# Patient Record
Sex: Male | Born: 1955 | Race: Black or African American | Hispanic: No | Marital: Married | Smoking: Current every day smoker
Health system: Southern US, Community
[De-identification: ages and names within clinical notes are randomized; demographics above are authoritative.]

## PROBLEM LIST (undated history)

## (undated) DIAGNOSIS — J449 Chronic obstructive pulmonary disease, unspecified: Secondary | ICD-10-CM

## (undated) DIAGNOSIS — J189 Pneumonia, unspecified organism: Secondary | ICD-10-CM

---

## 2017-10-30 ENCOUNTER — Inpatient Hospital Stay
Admission: EM | Admit: 2017-10-30 | Discharge: 2017-11-02 | DRG: 194 | Disposition: A | Discharge status: Home / Self Care | Payer: Self-pay | Attending: Internal Medicine | Admitting: Internal Medicine

## 2017-10-30 ENCOUNTER — Emergency Department: Payer: MEDICAID

## 2017-10-30 DIAGNOSIS — J101 Influenza due to other identified influenza virus with other respiratory manifestations: Secondary | ICD-10-CM

## 2017-10-30 DIAGNOSIS — J1000 Influenza due to other identified influenza virus with unspecified type of pneumonia: Principal | ICD-10-CM | POA: Diagnosis present

## 2017-10-30 DIAGNOSIS — F17210 Nicotine dependence, cigarettes, uncomplicated: Secondary | ICD-10-CM | POA: Diagnosis present

## 2017-10-30 DIAGNOSIS — J189 Pneumonia, unspecified organism: Secondary | ICD-10-CM

## 2017-10-30 DIAGNOSIS — J449 Chronic obstructive pulmonary disease, unspecified: Secondary | ICD-10-CM

## 2017-10-30 DIAGNOSIS — J440 Chronic obstructive pulmonary disease with acute lower respiratory infection: Secondary | ICD-10-CM | POA: Diagnosis present

## 2017-10-30 DIAGNOSIS — J441 Chronic obstructive pulmonary disease with (acute) exacerbation: Secondary | ICD-10-CM | POA: Diagnosis present

## 2017-10-30 HISTORY — DX: Chronic obstructive pulmonary disease, unspecified: J44.9

## 2017-10-30 HISTORY — DX: Pneumonia, unspecified organism: J18.9

## 2017-10-30 LAB — COMPREHENSIVE METABOLIC PANEL
ALT: 28 U/L (ref 0–55)
AST (SGOT): 77 U/L — ABNORMAL HIGH (ref 5–34)
Albumin/Globulin Ratio: 1 (ref 0.9–2.2)
Albumin: 3.9 g/dL (ref 3.5–5.0)
Alkaline Phosphatase: 142 U/L — ABNORMAL HIGH (ref 38–106)
BUN: 12 mg/dL (ref 9.0–28.0)
Bilirubin, Total: 0.7 mg/dL (ref 0.2–1.2)
CO2: 22 mEq/L (ref 22–29)
Calcium: 9.2 mg/dL (ref 8.5–10.5)
Chloride: 98 mEq/L — ABNORMAL LOW (ref 100–111)
Creatinine: 1.2 mg/dL (ref 0.7–1.3)
Globulin: 3.8 g/dL — ABNORMAL HIGH (ref 2.0–3.6)
Glucose: 99 mg/dL (ref 70–100)
Potassium: 3.3 mEq/L — ABNORMAL LOW (ref 3.5–5.1)
Protein, Total: 7.7 g/dL (ref 6.0–8.3)
Sodium: 133 mEq/L — ABNORMAL LOW (ref 136–145)

## 2017-10-30 LAB — B-TYPE NATRIURETIC PEPTIDE: B-Natriuretic Peptide: 238 pg/mL — ABNORMAL HIGH (ref 0–100)

## 2017-10-30 LAB — CBC AND DIFFERENTIAL
Absolute NRBC: 0 10*3/uL (ref 0.00–0.00)
Basophils Absolute Automated: 0.05 10*3/uL (ref 0.00–0.08)
Basophils Automated: 0.3 %
Eosinophils Absolute Automated: 0 10*3/uL (ref 0.00–0.44)
Eosinophils Automated: 0 %
Hematocrit: 47.8 % (ref 37.6–49.6)
Hgb: 16.3 g/dL (ref 12.5–17.1)
Immature Granulocytes Absolute: 0.18 10*3/uL — ABNORMAL HIGH (ref 0.00–0.07)
Immature Granulocytes: 0.9 %
Lymphocytes Absolute Automated: 0.7 10*3/uL (ref 0.42–3.22)
Lymphocytes Automated: 3.6 %
MCH: 34 pg — ABNORMAL HIGH (ref 25.1–33.5)
MCHC: 34.1 g/dL (ref 31.5–35.8)
MCV: 99.8 fL — ABNORMAL HIGH (ref 78.0–96.0)
MPV: 10.1 fL (ref 8.9–12.5)
Monocytes Absolute Automated: 2.56 10*3/uL — ABNORMAL HIGH (ref 0.21–0.85)
Monocytes: 13.1 %
Neutrophils Absolute: 16.02 10*3/uL — ABNORMAL HIGH (ref 1.10–6.33)
Neutrophils: 82.1 %
Nucleated RBC: 0 /100 WBC (ref 0.0–0.0)
Platelets: 261 10*3/uL (ref 142–346)
RBC: 4.79 10*6/uL (ref 4.20–5.90)
RDW: 12 % (ref 11–15)
WBC: 19.51 10*3/uL — ABNORMAL HIGH (ref 3.10–9.50)

## 2017-10-30 LAB — ECG 12-LEAD
Atrial Rate: 131 {beats}/min
Atrial Rate: 131 {beats}/min
P Axis: 71 degrees
P Axis: 71 degrees
P-R Interval: 180 ms
P-R Interval: 180 ms
Q-T Interval: 268 ms
Q-T Interval: 268 ms
QRS Duration: 98 ms
QRS Duration: 98 ms
QTC Calculation (Bezet): 395 ms
QTC Calculation (Bezet): 395 ms
R Axis: -78 degrees
R Axis: -78 degrees
T Axis: 93 degrees
T Axis: 93 degrees
Ventricular Rate: 131 {beats}/min
Ventricular Rate: 131 {beats}/min

## 2017-10-30 LAB — URINALYSIS, REFLEX TO MICROSCOPIC EXAM IF INDICATED
Bilirubin, UA: NEGATIVE
Blood, UA: NEGATIVE
Glucose, UA: NEGATIVE
Ketones UA: 20 — AB
Leukocyte Esterase, UA: NEGATIVE
Nitrite, UA: NEGATIVE
Protein, UR: 30 — AB
Specific Gravity UA: 1.015 (ref 1.001–1.035)
Urine pH: 5 (ref 5.0–8.0)
Urobilinogen, UA: NORMAL mg/dL

## 2017-10-30 LAB — I-STAT CG4 VENOUS CARTRIDGE
Lactic Acid I-Stat: 1 mmol/L (ref 0.2–2.0)
i-STAT Base Excess Venous: 0 mEq/L
i-STAT FIO2: 26
i-STAT HCO3 Bicarbonate Venous: 24.5 mEq/L
i-STAT Liters Per Minute: 2
i-STAT O2 Saturation Venous: 35 %
i-STAT Patient Temperature: 99.5
i-STAT Total CO2 Venous: 26 mEq/L
i-STAT pCO2 Venous: 39.5
i-STAT pH Venous: 7.401
i-STAT pO2 Venous: 22

## 2017-10-30 LAB — GFR: EGFR: >60

## 2017-10-30 LAB — TROPONIN I
Troponin I: 0.06 ng/mL (ref 0.00–0.09)
Troponin I: 0.04 ng/mL (ref 0.00–0.09)

## 2017-10-30 LAB — MAGNESIUM: Magnesium: 2 mg/dL (ref 1.6–2.6)

## 2017-10-30 MED ORDER — ALBUTEROL SULFATE (2.5 MG/3ML) 0.083% IN NEBU
5.00 mg | INHALATION_SOLUTION | Freq: Once | RESPIRATORY_TRACT | Status: AC
Start: 2017-10-30 — End: 2017-10-30
  Administered 2017-10-30: 10:00:00 5 mg via RESPIRATORY_TRACT
  Filled 2017-10-30: qty 6

## 2017-10-30 MED ORDER — LEVOFLOXACIN IN D5W 750 MG/150ML IV SOLN
750.00 mg | Freq: Once | INTRAVENOUS | Status: AC
Start: 2017-10-30 — End: 2017-10-30
  Administered 2017-10-30: 10:00:00 750 mg via INTRAVENOUS
  Filled 2017-10-30: qty 150

## 2017-10-30 MED ORDER — SODIUM CHLORIDE 0.9 % IV BOLUS
30.00 mL/kg | Freq: Once | INTRAVENOUS | Status: AC
Start: 2017-10-30 — End: 2017-10-30
  Administered 2017-10-30: 10:00:00 1974 mL via INTRAVENOUS

## 2017-10-30 MED ORDER — HEPARIN SODIUM (PORCINE) 5000 UNIT/ML IJ SOLN
5000.00 [IU] | Freq: Three times a day (TID) | INTRAMUSCULAR | Status: DC
Start: 2017-10-30 — End: 2017-11-02
  Administered 2017-10-30 – 2017-11-02 (×6): 5000 [IU] via SUBCUTANEOUS
  Filled 2017-10-30 (×6): qty 1

## 2017-10-30 MED ORDER — OSELTAMIVIR PHOSPHATE 75 MG PO CAPS
75.00 mg | ORAL_CAPSULE | Freq: Once | ORAL | Status: AC
Start: 2017-10-30 — End: 2017-10-30
  Administered 2017-10-30: 11:00:00 75 mg via ORAL
  Filled 2017-10-30: qty 1

## 2017-10-30 MED ORDER — ACETAMINOPHEN 325 MG PO TABS
650.0000 mg | ORAL_TABLET | ORAL | Status: DC | PRN
Start: 2017-10-30 — End: 2017-11-02

## 2017-10-30 MED ORDER — IBUPROFEN 600 MG PO TABS
600.00 mg | ORAL_TABLET | Freq: Once | ORAL | Status: AC
Start: 2017-10-30 — End: 2017-10-30
  Administered 2017-10-30: 10:00:00 600 mg via ORAL
  Filled 2017-10-30: qty 1

## 2017-10-30 MED ORDER — METHYLPREDNISOLONE SODIUM SUCC 125 MG IJ SOLR
125.00 mg | Freq: Once | INTRAMUSCULAR | Status: AC
Start: 2017-10-30 — End: 2017-10-30
  Administered 2017-10-30: 10:00:00 125 mg via INTRAVENOUS
  Filled 2017-10-30: qty 2

## 2017-10-30 MED ORDER — METHYLPREDNISOLONE SODIUM SUCC 40 MG IJ SOLR
40.00 mg | Freq: Three times a day (TID) | INTRAMUSCULAR | Status: DC
Start: 2017-10-30 — End: 2017-11-01
  Administered 2017-10-30 – 2017-11-01 (×6): 40 mg via INTRAVENOUS
  Filled 2017-10-30 (×6): qty 1

## 2017-10-30 MED ORDER — ALBUTEROL-IPRATROPIUM 2.5-0.5 (3) MG/3ML IN SOLN
3.00 mL | Freq: Four times a day (QID) | RESPIRATORY_TRACT | Status: DC
Start: 2017-10-30 — End: 2017-11-02
  Administered 2017-10-30 – 2017-11-02 (×8): 3 mL via RESPIRATORY_TRACT
  Filled 2017-10-30 (×8): qty 3

## 2017-10-30 MED ORDER — OSELTAMIVIR PHOSPHATE 75 MG PO CAPS
75.00 mg | ORAL_CAPSULE | Freq: Two times a day (BID) | ORAL | Status: DC
Start: 2017-10-30 — End: 2017-11-02
  Administered 2017-10-30 – 2017-11-02 (×6): 75 mg via ORAL
  Filled 2017-10-30 (×8): qty 1

## 2017-10-30 MED ORDER — IPRATROPIUM BROMIDE 0.02 % IN SOLN
0.50 mg | Freq: Once | RESPIRATORY_TRACT | Status: AC
Start: 2017-10-30 — End: 2017-10-30
  Administered 2017-10-30: 10:00:00 0.5 mg via RESPIRATORY_TRACT
  Filled 2017-10-30: qty 2.5

## 2017-10-30 MED ORDER — ONDANSETRON HCL 4 MG/2ML IJ SOLN
4.00 mg | Freq: Three times a day (TID) | INTRAMUSCULAR | Status: DC | PRN
Start: 2017-10-30 — End: 2017-11-02

## 2017-10-30 MED ORDER — ACETAMINOPHEN 500 MG PO TABS
1000.00 mg | ORAL_TABLET | Freq: Once | ORAL | Status: AC
Start: 2017-10-30 — End: 2017-10-30
  Administered 2017-10-30: 11:00:00 1000 mg via ORAL
  Filled 2017-10-30: qty 2

## 2017-10-30 MED ORDER — SENNOSIDES-DOCUSATE SODIUM 8.6-50 MG PO TABS
2.00 | ORAL_TABLET | Freq: Two times a day (BID) | ORAL | Status: DC | PRN
Start: 2017-10-30 — End: 2017-11-02

## 2017-10-30 MED ORDER — LEVOFLOXACIN IN D5W 750 MG/150ML IV SOLN
750.00 mg | INTRAVENOUS | Status: DC
Start: 2017-10-31 — End: 2017-10-31
  Filled 2017-10-30: qty 150

## 2017-10-30 NOTE — ED Notes (Signed)
Bed: S 23  Expected date:   Expected time:   Means of arrival:   Comments:  M 429

## 2017-10-30 NOTE — H&P (Signed)
ADMISSION HISTORY AND PHYSICAL EXAM    Date Time: 10/30/17 4:23 PM  Patient Name: Gary Hunter,Gary Gary JR.  Attending Physician: Gae Bon, MD    Assessment:   Acute influenza   CAP   Copd exacerbation       Plan:   Admit to inpatient     1. Acute influenza   tamiflu   Supportive care     2. Cad - levaquin   Follow cx     3. Copd exacerbation -   Nebs   Steroids     dvt prophylaxis       History of Present Illness:   Gary Hunter. is a 62 y.o. male who presents to the hospital with COPD, recent pneumonia; BIBA who presents with SOB.     Pt with worsening cough for the past few days. He started having SOB this morning and appeared mildly confused by family, which prompted 911 call. EMS noted pt O2 sats 88% on RA, placed on 4L O2 with improvement to 96%. Febrile to 100.8F en route. Pt states he feels his symptoms are similar to previous pneumonia. Took Tylenol this morning (does not remember what time). Denies chest pain.     Per daughter, pt took a 36 hr bus ride 3 days ago from [place] and started coughing after that .   Notes was treated for pneumonia by PCP 1 month ago . Does not recall abx .       Past Medical History:     Past Medical History:   Diagnosis Date   . Chronic obstructive pulmonary disease    . Pneumonia        Past Surgical History:   History reviewed. No pertinent surgical history.    Family History:   History reviewed. No pertinent family history.    Social History:     Social History     Social History   . Marital status: Married     Spouse name: N/A   . Number of children: N/A   . Years of education: N/A     Social History Main Topics   . Smoking status: Current Every Day Smoker     Packs/day: 1.00     Types: Cigarettes   . Smokeless tobacco: Never Used   . Alcohol use Yes      Comment: drinks beer socially   . Drug use: No   . Sexual activity: Not on file     Other Topics Concern   . Not on file     Social History Narrative   . No narrative on file       Allergies:   No Known  Allergies    Medications:     No prescriptions prior to admission.       Review of Systems:   A comprehensive review of systems was: Negative except hpi   History obtained from chart review and the patient  Respiratory ROS: positive for - cough and shortness of breath  Cardiovascular ROS: positive for - chest pain and dyspnea on exertion  Gastrointestinal ROS: no abdominal pain, change in bowel habits, or black or bloody stools  Genito-Urinary ROS: no dysuria, trouble voiding, or hematuria  Musculoskeletal ROS: negative  Neurological ROS: no TIA or stroke symptoms    Physical Exam:     Vitals:    10/30/17 1500   BP: 107/57   Pulse: 88   Resp: (!) 25   Temp:    SpO2: 96%  Intake and Output Summary (Last 24 hours) at Date Time  No intake or output data in the 24 hours ending 10/30/17 1623    General appearance - alert, well appearing, and in no distress and oriented to person, place, and time  Mental status - alert, oriented to person, place, and time  Eyes - pupils equal and reactive, extraocular eye movements intact  Chest - wheezing noted bilateral diminished   Heart - normal rate and regular rhythm  Abdomen - soft, nontender, nondistended, no masses or organomegaly  Neurological - cranial nerves II through XII intact  Musculoskeletal - 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    Labs:     Results     Procedure Component Value Units Date/Time    Blood Culture Aerobic/Anaerobic #1 [[id number]] Collected:  10/30/17 0931    Specimen:  Arm from Blood, Intravenous Line Updated:  10/30/17 1327    Narrative:       1 BLUE+1 PURPLE    Blood Culture Aerobic/Anaerobic #2 [[id number]] Collected:  10/30/17 0932    Specimen:  Arm from Blood, Venipuncture Updated:  10/30/17 1327    Narrative:       1 BLUE+1 PURPLE    B-type Natriuretic Peptide [[id number]]  (Abnormal) Collected:  10/30/17 0928    Specimen:  Blood Updated:  10/30/17 1205     B-Natriuretic Peptide 238 (H) pg/mL     CBC with differential [[id number]]  (Abnormal)  Collected:  10/30/17 0928    Specimen:  Blood from Blood Updated:  10/30/17 1104     WBC 19.51 (H) x10 3/uL      Hgb 16.3 g/dL      Hematocrit 06.3 %      Platelets 261 x10 3/uL      RBC 4.79 x10 6/uL      MCV 99.8 (H) fL      MCH 34.0 (H) pg      MCHC 34.1 g/dL      RDW 12 %      MPV 10.1 fL      Neutrophils 82.1 %      Lymphocytes Automated 3.6 %      Monocytes 13.1 %      Eosinophils Automated 0.0 %      Basophils Automated 0.3 %      Immature Granulocyte 0.9 %      Nucleated RBC 0.0 /100 WBC      Neutrophils Absolute 16.02 (H) x10 3/uL      Abs Lymph Automated 0.70 x10 3/uL      Abs Mono Automated 2.56 (H) x10 3/uL      Abs Eos Automated 0.00 x10 3/uL      Absolute Baso Automated 0.05 x10 3/uL      Absolute Immature Granulocyte 0.18 (H) x10 3/uL      Absolute NRBC 0.00 x10 3/uL     Troponin I [[id number]] Collected:  10/30/17 0929    Specimen:  Blood Updated:  10/30/17 1025     Troponin I 0.06 ng/mL     Comprehensive metabolic panel [[id number]]  (Abnormal) Collected:  10/30/17 0928    Specimen:  Blood Updated:  10/30/17 1021     Glucose 99 mg/dL      BUN 54.2 mg/dL      Creatinine 1.2 mg/dL      Sodium 706 (L) mEq/L      Potassium 3.3 (L) mEq/L      Chloride 98 (L) mEq/L      CO2 22 mEq/L  Calcium 9.2 mg/dL      Protein, Total 7.7 g/dL      Albumin 3.9 g/dL      AST (SGOT) 77 (H) U/L      ALT 28 U/L      Alkaline Phosphatase 142 (H) U/L      Bilirubin, Total 0.7 mg/dL      Globulin 3.8 (H) g/dL      Albumin/Globulin Ratio 1.0    Magnesium [[id number]] Collected:  10/30/17 0928    Specimen:  Blood Updated:  10/30/17 1021     Magnesium 2.0 mg/dL     GFR [[id number]] Collected:  10/30/17 0928     Updated:  10/30/17 1021     EGFR >60.0    Rapid influenza A/B antigens [[id number]] Collected:  10/30/17 0931    Specimen:  Nasopharyngeal from Nasal Aspirate Updated:  10/30/17 1015    Narrative:       ORDER#: [id number]                                    ORDERED BY: Haywood Pao, ALI  SOURCE: Nasal Aspirate                                COLLECTED:  10/30/17 09:31  ANTIBIOTICS AT COLL.:                                RECEIVED :  10/30/17 09:46  Influenza Rapid Antigen A&B                FINAL       10/30/17 10:15   +  10/30/17   Positive for Influenza A and Negative for Influenza B             Results called to [id number] on  10/30/2017  10:15             Readback confirmed by [id number]             Reference Range: Negative      i-Stat CG4 Venous CartrIDge [[id number]] Collected:  10/30/17 0926     Updated:  10/30/17 0928     i-STAT pH Venous 7.401     i-STAT pCO2 Venous 39.5     i-STAT pO2 Venous 22.0     i-STAT HCO3 Bicarbonate Venous 24.5 mEq/L      i-STAT Total CO2 Venous 26.0 mEq/L      i-STAT Base Excess Venous 0.0 mEq/L      i-STAT O2 Saturation Venous 35.0 %      i-STAT Lactic acid 1.0 mmol/L      i-STAT Patient Temperature 99.5 F     i-STAT FIO2 26     i-STAT O2 Delivery Nasal Can     i-STAT Allen's Test NA     i-STAT ECMO No     i-STAT Draw Site Venous     i-STAT Liters Per Minute 2.0            Rads:   Radiological Procedure reviewed.    Signed by: Zenovia Jarred

## 2017-10-30 NOTE — ED Notes (Signed)
Bed: N 26  Expected date:   Expected time:   Means of arrival:   Comments:  23 here

## 2017-10-30 NOTE — ED Provider Notes (Signed)
[facility] EMERGENCY DEPARTMENT H&P         CLINICAL INFORMATION        HPI:      Chief Complaint: Shortness of Breath  .    Gary Hunter. is a 62 y.o. male with a PMHx of COPD, recent pneumonia; BIBA who presents with SOB.     Pt with worsening cough for the past few days. He started having SOB this morning and appeared mildly confused by family, which prompted 911 call. EMS noted pt O2 sats 88% on RA, placed on 4L O2 with improvement to 96%. Febrile to 100.67F en route. Pt states he feels his symptoms are similar to previous pneumonia. Took Tylenol this morning (does not remember what time). Denies chest pain.     Per daughter, pt took a 36 hr bus ride 3 days ago from [place] and started coughing after that.     History obtained from: EMS  Caveat: HPI caveat invoked due to patient's mental status.          ROS:      Caveat: Unable to complete ROS due to patient mental status        Physical Exam:      Pulse (!) 132  BP 134/63  Resp 20  SpO2 95 %  Temp 99.4 F (37.4 C)    Physical Exam   Constitutional: He appears well-developed and well-nourished. No distress.   Thin and mildly ill-appearing.   HENT:   Head: Normocephalic and atraumatic.   Eyes: Pupils are equal, round, and reactive to light. Conjunctivae and EOM are normal.   Cardiovascular: Regular rhythm, normal heart sounds and intact distal pulses.  Tachycardia present.    Pulmonary/Chest: Effort normal. No respiratory distress. He has no wheezes.   Diminished BS BL.   Abdominal: Soft. Bowel sounds are normal. There is no tenderness.   Musculoskeletal: Normal range of motion. He exhibits no edema or tenderness.   Neurological: He is alert. No cranial nerve deficit.   A&O x2   Skin: Skin is warm and dry. He is not diaphoretic.   Psychiatric: He has a normal mood and affect. His behavior is normal. Judgment and thought content normal.                PAST HISTORY        Primary Care Provider: Lennox Pippins,  MD        PMH/PSH:    .     Past Medical History:   Diagnosis Date   . Chronic obstructive pulmonary disease    . Pneumonia        He has no past surgical history on file.      Social/Family History:      He reports that he has been smoking Cigarettes.  He has been smoking about 1.00 pack per day. He has never used smokeless tobacco. He reports that he drinks alcohol. He reports that he does not use drugs.    History reviewed. No pertinent family history.      Listed Medications on Arrival:    .     Home Medications     Med List Status:  In Progress Set By: Lorra Hals, RN at 10/30/2017  9:19 AM        No Medications         Allergies: He has No Known Allergies.           Visit date: 10/30/2017  CLINICAL SUMMARY          Diagnosis:    .     Final diagnoses:   Pneumonia of both lungs due to infectious organism, unspecified part of lung   Influenza A         MDM Notes:        DDx-  COPD exacerbation, pneumonia, bronchitis, dehydration, anemia, influenza      Pt is a 61yM who presents with fever, cough, shortness of breath. Recent PNA several months ago. H/o COPD. Recent travel by bus with subsequent development of cough. CXR with PNA. Flu A+. Levaquin and Tamiflu given. IVF and antipyretics given, HR improved. Nebs and steroids given, resp status improved. Given clear alternate etiology, low concern for PE. No sepsis noted. Will admit.              VISIT INFORMATION        Clinical Course in the ED:      Sepsis Clinical Course:   BP 122/58   Pulse (!) 115   Temp 99.5 F (37.5 C) (Oral)   Resp 15   Ht 6' (1.829 m)   Wt 65.8 kg   SpO2 97%   BMI 19.67 kg/m       On patient arrival to the emergency department, patient met SIRS criteria with (at least two of the following): Temp > 100.9 and Pulse > 90 bpm. Lactate pending.     Lactate result at 9:26AM: Lactate negative at 1.0. Will not pursue sepsis at this time. Reassess following urine, CBC and CXR.         ED Course as of Oct 30 1045   Tue Oct 30, 2017    1047 D/w Dr. Cleatis Polka, will admit to medicine under her service.   [SF]      ED Course User Index  [SF] Jola Schmidt                Medications Given in the ED:    .     ED Medication Orders     Start Ordered     Status Ordering Provider    10/30/17 1038 10/30/17 1037  acetaminophen (TYLENOL) tablet 1,000 mg  Once     Route: Oral  Ordered Dose: 1,000 mg     Ordered Claudie Rathbone ABID    10/30/17 1023 10/30/17 1022  oseltamivir (TAMIFLU) capsule 75 mg  Once     Route: Oral  Ordered Dose: 75 mg     Ordered Myan Locatelli ABID    10/30/17 0947 10/30/17 0946  methylPREDNISolone sodium succinate (Solu-MEDROL) injection 125 mg  Once     Route: Intravenous  Ordered Dose: 125 mg     Last MAR action:  Given Sinahi Knights ABID    10/30/17 0946 10/30/17 0946  levoFLOXacin (LEVAQUIN) 750mg  in D5W IVPB (premix)  Once     Route: Intravenous  Ordered Dose: 750 mg     Last [facility] action:  New Bag Lamin Chandley ABID    10/30/17 0924 10/30/17 0923  ibuprofen (ADVIL,MOTRIN) tablet 600 mg  Once     Route: Oral  Ordered Dose: 600 mg     Last MAR action:  Given Treylin Burtch ABID    10/30/17 0923 10/30/17 0922  sodium chloride 0.9 % bolus 1,974 mL  Once     Route: Intravenous  Ordered Dose: 30 mL/kg     Last [facility] action:  [employer], Kaelon Weekes ABID    10/30/17 1610 10/30/17 9604  ipratropium (ATROVENT) 0.02 % nebulizer solution 0.5 mg  RT - Once     Route: Nebulization  Ordered Dose: 0.5 mg     Last MAR action:  Given Lockie Bothun ABID    10/30/17 0923 10/30/17 0923  albuterol (PROVENTIL) (2.5 MG/3ML) 0.083% nebulizer solution 5 mg  RT - Once     Route: Nebulization  Ordered Dose: 5 mg     Last MAR action:  Given Margherita Collyer ABID            Procedures:      Procedures      Interpretations:        O2 sat saturation: 95 %; Oxygen use: 2L NC; Interpretation: borderline normal    EKG interpreted by me: sinus tachycardia at 131. LAFB. LVH.   Monitor interpreted by me: sinus tachycardia in 130s.                 RESULTS        Lab Results:      Results      Procedure Component Value Units Date/Time    Troponin I [[id number]] Collected:  10/30/17 0929    Specimen:  Blood Updated:  10/30/17 1025     Troponin I 0.06 ng/mL     CBC with differential [[id number]]  (Abnormal) Collected:  10/30/17 0928    Specimen:  Blood from Blood Updated:  10/30/17 1025     WBC 19.51 (H) x10 3/uL      Hgb 16.3 g/dL      Hematocrit 57.8 %      Platelets 261 x10 3/uL      RBC 4.79 x10 6/uL      MCV 99.8 (H) fL      MCH 34.0 (H) pg      MCHC 34.1 g/dL      RDW 12 %      MPV 10.1 fL      Nucleated RBC 0.0 /100 WBC      Absolute NRBC 0.00 x10 3/uL     Comprehensive metabolic panel [[id number]]  (Abnormal) Collected:  10/30/17 0928    Specimen:  Blood Updated:  10/30/17 1021     Glucose 99 mg/dL      BUN 41.3 mg/dL      Creatinine 1.2 mg/dL      Sodium 244 (L) mEq/L      Potassium 3.3 (L) mEq/L      Chloride 98 (L) mEq/L      CO2 22 mEq/L      Calcium 9.2 mg/dL      Protein, Total 7.7 g/dL      Albumin 3.9 g/dL      AST (SGOT) 77 (H) U/L      ALT 28 U/L      Alkaline Phosphatase 142 (H) U/L      Bilirubin, Total 0.7 mg/dL      Globulin 3.8 (H) g/dL      Albumin/Globulin Ratio 1.0    Magnesium [[id number]] Collected:  10/30/17 0928    Specimen:  Blood Updated:  10/30/17 1021     Magnesium 2.0 mg/dL     GFR [[id number]] Collected:  10/30/17 0928     Updated:  10/30/17 1021     EGFR >60.0    Rapid influenza A/B antigens [[id number]] Collected:  10/30/17 0931    Specimen:  Nasopharyngeal from Nasal Aspirate Updated:  10/30/17 1015    Narrative:       ORDER#: [id number]  ORDERED BY: Chrystian Cupples  SOURCE: Nasal Aspirate                               COLLECTED:  10/30/17 09:31  ANTIBIOTICS AT COLL.:                                RECEIVED :  10/30/17 09:46  Influenza Rapid Antigen A&B                FINAL       10/30/17 10:15   +  10/30/17   Positive for Influenza A and Negative for Influenza B             Results called to [id number] on  10/30/2017  10:15             Readback  confirmed by [id number]             Reference Range: Negative      B-type Natriuretic Peptide [[id number]] Collected:  10/30/17 0928    Specimen:  Blood Updated:  10/30/17 0947    Blood Culture Aerobic/Anaerobic #2 [[id number]] Collected:  10/30/17 0932    Specimen:  Arm from Blood, Venipuncture Updated:  10/30/17 0932    Narrative:       1 BLUE+1 PURPLE    Blood Culture Aerobic/Anaerobic #1 [[id number]] Collected:  10/30/17 0931    Specimen:  Arm from Blood, Intravenous Line Updated:  10/30/17 0931    Narrative:       1 BLUE+1 PURPLE    i-Stat CG4 Venous CartrIDge [[id number]] Collected:  10/30/17 0926     Updated:  10/30/17 0928     i-STAT pH Venous 7.401     i-STAT pCO2 Venous 39.5     i-STAT pO2 Venous 22.0     i-STAT HCO3 Bicarbonate Venous 24.5 mEq/L      i-STAT Total CO2 Venous 26.0 mEq/L      i-STAT Base Excess Venous 0.0 mEq/L      i-STAT O2 Saturation Venous 35.0 %      i-STAT Lactic acid 1.0 mmol/L      i-STAT Patient Temperature 99.5 F     i-STAT FIO2 26     i-STAT O2 Delivery Nasal Can     i-STAT Allen's Test NA     i-STAT ECMO No     i-STAT Draw Site Venous     i-STAT Liters Per Minute 2.0              Radiology Results:          XR Chest  AP Portable   Final Result    Bilateral infiltrates.      Prince Solian, MD    10/30/2017 9:39 AM              Disposition:           Inpatient Admit      ED Disposition     ED Disposition Condition Date/Time Comment    Admit  Tue Oct 30, 2017 10:47 AM Admitting Physician: Zenovia Jarred [[id number]]   Diagnosis: Pneumonia [227785]   Estimated Length of Stay: > or = to 2 midnights   Tentative Discharge Plan?: Home or Self Care [1]   Patient Class: Inpatient [101]           CASE ACUITY SUMMARY        Pneumonia~~~~~~~~~~~~~~  Scribe Attestation:      I was acting as a Neurosurgeon for Palma Holter, MD on [employer].  Treatment Team: Scribe: Jola Schmidt     I am the first provider for this patient and I personally performed the services documented. Treatment Team:  Scribe: Jola Schmidt is scribing for me on Mcphearson,Sylvanus GABEL JR.Marland Kitchen This note and the patient instructions accurately reflect work and decisions made by me.  Palma Holter, MD          Gae Bon, MD  10/30/17 [phone]

## 2017-10-31 LAB — GFR: EGFR: >60

## 2017-10-31 LAB — LIPID PANEL
Cholesterol / HDL Ratio: 3.4
Cholesterol: 124 mg/dL (ref 0–199)
HDL: 36 mg/dL — ABNORMAL LOW (ref 40–9999)
LDL Calculated: 75 mg/dL (ref 0–99)
Triglycerides: 65 mg/dL (ref 34–149)
VLDL Calculated: 13 mg/dL (ref 10–40)

## 2017-10-31 LAB — CBC AND DIFFERENTIAL
Absolute NRBC: 0 10*3/uL (ref 0.00–0.00)
Basophils Absolute Automated: 0.02 10*3/uL (ref 0.00–0.08)
Basophils Automated: 0.1 %
Eosinophils Absolute Automated: 0 10*3/uL (ref 0.00–0.44)
Eosinophils Automated: 0 %
Hematocrit: 45.8 % (ref 37.6–49.6)
Hgb: 15.3 g/dL (ref 12.5–17.1)
Immature Granulocytes Absolute: 0.1 10*3/uL — ABNORMAL HIGH (ref 0.00–0.07)
Immature Granulocytes: 0.6 %
Lymphocytes Absolute Automated: 1.26 10*3/uL (ref 0.42–3.22)
Lymphocytes Automated: 7.8 %
MCH: 33 pg (ref 25.1–33.5)
MCHC: 33.4 g/dL (ref 31.5–35.8)
MCV: 98.9 fL — ABNORMAL HIGH (ref 78.0–96.0)
MPV: 10 fL (ref 8.9–12.5)
Monocytes Absolute Automated: 0.98 10*3/uL — ABNORMAL HIGH (ref 0.21–0.85)
Monocytes: 6.1 %
Neutrophils Absolute: 13.77 10*3/uL — ABNORMAL HIGH (ref 1.10–6.33)
Neutrophils: 85.4 %
Nucleated RBC: 0 /100 WBC (ref 0.0–0.0)
Platelets: 242 10*3/uL (ref 142–346)
RBC: 4.63 10*6/uL (ref 4.20–5.90)
RDW: 13 % (ref 11–15)
WBC: 16.13 10*3/uL — ABNORMAL HIGH (ref 3.10–9.50)

## 2017-10-31 LAB — COMPREHENSIVE METABOLIC PANEL
ALT: 23 U/L (ref 0–55)
AST (SGOT): 55 U/L — ABNORMAL HIGH (ref 5–34)
Albumin/Globulin Ratio: 0.9 (ref 0.9–2.2)
Albumin: 2.6 g/dL — ABNORMAL LOW (ref 3.5–5.0)
Alkaline Phosphatase: 91 U/L (ref 38–106)
BUN: 12 mg/dL (ref 9.0–28.0)
Bilirubin, Total: 0.3 mg/dL (ref 0.2–1.2)
CO2: 23 mEq/L (ref 22–29)
Calcium: 8.1 mg/dL — ABNORMAL LOW (ref 8.5–10.5)
Chloride: 107 mEq/L (ref 100–111)
Creatinine: 0.7 mg/dL (ref 0.7–1.3)
Globulin: 2.9 g/dL (ref 2.0–3.6)
Glucose: 125 mg/dL — ABNORMAL HIGH (ref 70–100)
Potassium: 3.8 mEq/L (ref 3.5–5.1)
Protein, Total: 5.5 g/dL — ABNORMAL LOW (ref 6.0–8.3)
Sodium: 137 mEq/L (ref 136–145)

## 2017-10-31 LAB — TROPONIN I: Troponin I: 0.02 ng/mL (ref 0.00–0.09)

## 2017-10-31 LAB — HEMOLYSIS INDEX: Hemolysis Index: 39 — ABNORMAL HIGH (ref 0–18)

## 2017-10-31 LAB — HEMOGLOBIN A1C
Average Estimated Glucose: 122.6 mg/dL
Hemoglobin A1C: 5.9 % (ref 4.6–5.9)

## 2017-10-31 LAB — TSH: TSH: 0.39 u[IU]/mL (ref 0.35–4.94)

## 2017-10-31 MED ORDER — LEVOFLOXACIN 500 MG PO TABS
750.00 mg | ORAL_TABLET | Freq: Every day | ORAL | Status: DC
Start: 2017-10-31 — End: 2017-11-02
  Administered 2017-10-31 – 2017-11-02 (×3): 750 mg via ORAL
  Filled 2017-10-31 (×3): qty 2

## 2017-10-31 NOTE — Progress Notes (Signed)
Nutrition Screen:  Reason: screen for unsure wt loss + poor appetite    Intervention:   Pt's medical status, pert labs and meds reviewed. No acute nutrition issues or learning needs identified at this time (no nutrition dx or additional intervention/goals needed).     Clinical Assessment/Progress:   Pt is a 62 year old male with acute influenza, CAP, and COPD exacerbation. Spoke with pt at bedside. Pt reports his appetite is never great, but he makes himself eat. Reports consuming 3 meals daily, consuming ~50% of meals. Pt reports his wt fluctuates frequently, UBW is about 150#. Denies nausea. Offered pt nutrition supplements, pt declined. Reviewed ways to increase calorie and protein intake.     Pertinent Labs: reviewed  Pertinent Meds: reviewed    Current Nutrition Prescription:   Diet/Supplement Order: cardiac    M/E:  Monitor po, med tx plan as needed. (Please consult RD if nutrition issue arises)     Rodell Perna, [place]  [employer]ra [phone]

## 2017-10-31 NOTE — UM Notes (Addendum)
Admit Date: 10/30/17  Status: Inpatient  Unit Location: Medsurg    HPI:   62 y.o. male who presents to the hospital with COPD, recent pneumonia; BIBA with SOB.   Pt with worsening cough for the past few days. He started having SOB this morning and appeared mildly confused, which prompted 911 call. EMS noted pt O2 sats 88% on RA, placed on 4L O2 with improvement to 96%. Febrile to 100.32F en route. Pt states he feels his symptoms are similar to previous pneumonia.   Per daughter, pt took a 36 hr bus ride 3 days ago from [place] and started coughing after that. Pt was treated for pneumonia by PCP 1 month ago .     VS: T 99.6, HR 132, RR 13-25, BP 97/54, O2 92-99% on 2L NC    LABS: sodium 133, potassium 3.3, wbc 19.51, Positive for Influenza A, blood cultures and urine cultures in progress    IMAGING:   Chest xray - Bilateral infiltrates    MEDS:   Duo-neb q6hr  Levofloxacin 750mg  IV once  Solu-Medrol 40mg  IV q8hr  NaCL IV bolus once    PLAN:   1. Acute influenza   tamiflu   Supportive care   2. CAD - levaquin   Follow cx   3. Copd exacerbation - Nebs   Steroids     Osa Craver, RN  Utilization Review Nurse  [employer]  [phone]

## 2017-10-31 NOTE — Progress Notes (Signed)
Discharge Planning---Case Management     10/31/17 0948   Patient Type   Within 30 Days of Previous Admission? No   Healthcare Decisions   Interviewed: Patient   Orientation/Decision Making Abilities of Patient Alert and Oriented x3, able to make decisions   Prior to admission   Prior level of function Independent with ADLs;Ambulates independently   Type of Residence Private residence   Home Layout Two level   Have running water, electricity, heat, etc? Yes   Living Arrangements Alone   How do you get to your MD appointments? self   How do you get your groceries? self   Who fixes your meals? family   Who does your laundry? self, family   Who picks up your prescriptions? self   Name of Prior Assisted Living Facility NA   Prior SNF admission? (Detail) NA   Prior Rehab admission? (Detail) NA   Adult Protective Services (APS) involved? No   Discharge Planning   Support Systems Children   Anticipated [place] plan discussed with: Same as interviewed   Potential barriers to discharge: Decreased mobility   Mode of transportation: Private car (family member)   Consults/Providers   Outcome Palliative Care Screen Screened but did not meet criteria for intervention   Correct PCP listed in Epic? Yes   Important Message from Medicare Notice   Patient received 1st IMM Letter? n/a       Dolores Frame, RN, BSN  [facility]-- Case Management  [phone]

## 2017-10-31 NOTE — Plan of Care (Signed)
Problem: Safety  Goal: Patient will be free from injury during hospitalization  Outcome: Progressing   10/31/17 2337   Goal/Interventions addressed this shift   Patient will be free from injury during hospitalization  Assess patient's risk for falls and implement fall prevention plan of care per policy;Provide and maintain safe environment;Use appropriate transfer methods;Ensure appropriate safety devices are available at the bedside;Include patient/ family/ care giver in decisions related to safety;Hourly rounding;Assess for patients risk for elopement and implement Elopement Risk Plan per policy;Provide alternative method of communication if needed (communication boards, writing)     Goal: Patient will be free from infection during hospitalization  Outcome: Progressing   10/31/17 2337   Goal/Interventions addressed this shift   Free from Infection during hospitalization Assess and monitor for signs and symptoms of infection;Monitor lab/diagnostic results       Problem: Psychosocial and Spiritual Needs  Goal: Demonstrates ability to cope with hospitalization/illness  Outcome: Progressing   10/31/17 2337   Goal/Interventions addressed this shift   Demonstrates ability to cope with hospitalizations/illness Encourage verbalization of feelings/concerns/expectations;Provide quiet environment;Assist patient to identify own strengths and abilities;Encourage patient to set small goals for self;Encourage participation in diversional activity;Reinforce positive adaptation of new coping behaviors;Include patient/ patient care companion in decisions       Problem: Compromised Hemodynamic Status  Goal: Vital signs and fluid balance maintained/improved  Outcome: Progressing      Problem: Inadequate Gas Exchange  Goal: Adequate oxygenation and improved ventilation  Outcome: Progressing   10/31/17 2337   Goal/Interventions addressed this shift   Adequate oxygenation and improved ventilation Assess lung sounds;Monitor SpO2 and  treat as needed;Provide mechanical and oxygen support to facilitate gas exchange;Position for maximum ventilatory efficiency;Teach/reinforce use of incentive spirometer 10 times per hour while awake, cough and deep breath as needed;Plan activities to conserve energy: plan rest periods;Consult/collaborate with Respiratory Therapy;Increase activity as tolerated/progressive mobility       Problem: Inadequate Airway Clearance  Goal: Normal respiratory rate/effort achieved/maintained  Outcome: Progressing      Problem: Inadequate Tissue Perfusion-Venous  Goal: Tissue perfusion is adequate-venous  Outcome: Progressing   10/31/17 2337   Goal/Interventions addressed this shift   Tissue perfusion is adequate-venous  Increase activity as tolerated / progressive mobility;Teach/review/reinforce ankle pump exercises;VTE prevention: Administer anticoagulant(s) and/or apply anti-embolism stockings/devices as ordered;Elevate feet when in chair       Problem: Impaired Mobility  Goal: Mobility/Activity is maintained at optimal level for patient  Outcome: Progressing      Problem: Nutrition  Goal: Nutritional intake is adequate  Outcome: Progressing   10/31/17 2337   Goal/Interventions addressed this shift   Nutritional intake is adequate Allow adequate time for meals;Include patient/patient care companion in decisions related to nutrition

## 2017-10-31 NOTE — Progress Notes (Signed)
Progress note     Date Time: 10/31/17 10:36 AM  Patient Name: Gary Hunter,Gary GABEL JR.  Attending Physician: Zenovia Jarred, MD    Assessment:   Acute influenza   CAP   Copd exacerbation       Plan:   Admit to inpatient     1. Acute influenza   tamiflu  - resume same .   Supportive care     2. Cad - levaquin   Follow cx     3. Copd exacerbation -   Nebs   Steroids  - change to prednisone tomorrow .     dvt prophylaxis       History of Present Illness:   Gary Hunter. is a 62 y.o. male who presents to the hospital with COPD, recent pneumonia; BIBA who presents with SOB.     Pt with worsening cough for the past few days. He started having SOB this morning and appeared mildly confused by family, which prompted 911 call. EMS noted pt O2 sats 88% on RA, placed on 4L O2 with improvement to 96%. Febrile to 100.42F en route. Pt states he feels his symptoms are similar to previous pneumonia. Took Tylenol this morning (does not remember what time). Denies chest pain.     Per daughter, pt took a 36 hr bus ride 3 days ago from [place] and started coughing after that .   Notes was treated for pneumonia by PCP 1 month ago . Does not recall abx .   5/15 - feels better  , brathing improving .       Past Medical History:     Past Medical History:   Diagnosis Date   . Chronic obstructive pulmonary disease    . Pneumonia        Past Surgical History:   History reviewed. No pertinent surgical history.    Family History:   History reviewed. No pertinent family history.    Social History:     Social History     Social History   . Marital status: Married     Spouse name: N/A   . Number of children: N/A   . Years of education: N/A     Social History Main Topics   . Smoking status: Current Every Day Smoker     Packs/day: 1.00     Types: Cigarettes   . Smokeless tobacco: Never Used   . Alcohol use Yes      Comment: drinks beer socially   . Drug use: No   . Sexual activity: Not on file     Other Topics Concern   . Not on file     Social  History Narrative   . No narrative on file       Allergies:   No Known Allergies    Medications:     No prescriptions prior to admission.       Review of Systems:   A comprehensive review of systems was: Negative except hpi   History obtained from chart review and the patient  Respiratory ROS: positive for - cough and shortness of breath  Cardiovascular ROS: positive for - chest pain and dyspnea on exertion  Gastrointestinal ROS: no abdominal pain, change in bowel habits, or black or bloody stools  Genito-Urinary ROS: no dysuria, trouble voiding, or hematuria  Musculoskeletal ROS: negative  Neurological ROS: no TIA or stroke symptoms    Physical Exam:     Vitals:    10/31/17 4010  BP:    Pulse:    Resp: 18   Temp:    SpO2:        Intake and Output Summary (Last 24 hours) at Date Time    Intake/Output Summary (Last 24 hours) at 10/31/17 1036  Last data filed at 10/31/17 6387   Gross per 24 hour   Intake              300 ml   Output             1300 ml   Net            -1000 ml       General appearance - alert, well appearing, improved   Mental status - alert, oriented to person, place, and time  Eyes - pupils equal and reactive, extraocular eye movements intact  Chest - wheezing noted bilateral diminished - better air entry   Heart - normal rate and regular rhythm  Abdomen - soft, nontender, nondistended, no masses or organomegaly  Neurological - cranial nerves II through XII intact  Musculoskeletal - 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      Labs:     Results     Procedure Component Value Units Date/Time    Comprehensive metabolic panel [[id number]]  (Abnormal) Collected:  10/31/17 0358    Specimen:  Blood Updated:  10/31/17 0529     Glucose 125 (H) mg/dL      BUN 88.4 mg/dL      Creatinine 0.7 mg/dL      Sodium 166 mEq/L      Potassium 3.8 mEq/L      Chloride 107 mEq/L      CO2 23 mEq/L      Calcium 8.1 (L) mg/dL      Protein, Total 5.5 (L) g/dL      Albumin 2.6 (L) g/dL      AST (SGOT) 55 (H) U/L      ALT 23  U/L      Alkaline Phosphatase 91 U/L      Bilirubin, Total 0.3 mg/dL      Globulin 2.9 g/dL      Albumin/Globulin Ratio 0.9    GFR [[id number]] Collected:  10/31/17 0358     Updated:  10/31/17 0529     EGFR >60.0    TSH [[id number]] Collected:  10/31/17 0358    Specimen:  Blood Updated:  10/31/17 0510     TSH 0.39 uIU/mL     Lipid panel [[id number]]  (Abnormal) Collected:  10/31/17 0358    Specimen:  Blood Updated:  10/31/17 0509     Cholesterol 124 mg/dL      Triglycerides 65 mg/dL      HDL 36 (L) mg/dL      LDL Calculated 75 mg/dL      VLDL Cholesterol Cal 13 mg/dL      CHOL/HDL Ratio 3.4    Hemolysis index [[id number]]  (Abnormal) Collected:  10/31/17 0358     Updated:  10/31/17 0509     Hemolysis Index 39 (H)    Troponin I [[id number]] Collected:  10/31/17 0358    Specimen:  Blood Updated:  10/31/17 0455     Troponin I 0.02 ng/mL     CBC and differential [[id number]]  (Abnormal) Collected:  10/31/17 0358    Specimen:  Blood from Blood Updated:  10/31/17 0428     WBC 16.13 (H) x10 3/uL      Hgb 15.3 g/dL  Hematocrit 45.8 %      Platelets 242 x10 3/uL      RBC 4.63 x10 6/uL      MCV 98.9 (H) fL      MCH 33.0 pg      MCHC 33.4 g/dL      RDW 13 %      MPV 10.0 fL      Neutrophils 85.4 %      Lymphocytes Automated 7.8 %      Monocytes 6.1 %      Eosinophils Automated 0.0 %      Basophils Automated 0.1 %      Immature Granulocyte 0.6 %      Nucleated RBC 0.0 /100 WBC      Neutrophils Absolute 13.77 (H) x10 3/uL      Abs Lymph Automated 1.26 x10 3/uL      Abs Mono Automated 0.98 (H) x10 3/uL      Abs Eos Automated 0.00 x10 3/uL      Absolute Baso Automated 0.02 x10 3/uL      Absolute Immature Granulocyte 0.10 (H) x10 3/uL      Absolute NRBC 0.00 x10 3/uL     Hemoglobin A1C [[id number]] Collected:  10/30/17 1646    Specimen:  Blood Updated:  10/31/17 0111     Hemoglobin A1C 5.9 %      Average Estimated Glucose 122.6 mg/dL     Urine culture [[id number]] Collected:  10/30/17 1956    Specimen:  Urine from Urine, Clean Catch  Updated:  10/30/17 2249    Narrative:       Replace urinary catheter prior to obtaining the urine culture  if it has been in place for greater than or equal to 14  days:->N/A No Foley  Indications for Urine Culture:->Suprapubic Pain/Tenderness or  Dysuria    UA, Reflex to Microscopic (pts  3 + yrs) [[id number]]  (Abnormal) Collected:  10/30/17 1956    Specimen:  Urine Updated:  10/30/17 2033     Urine Type Clean Catch     Color, UA Yellow     Clarity, UA Hazy     Specific Gravity UA 1.015     Urine pH 5.0     Leukocyte Esterase, UA Negative     Nitrite, UA Negative     Protein, UR 30 (A)     Glucose, UA Negative     Ketones UA 20 (A)     Urobilinogen, UA Normal mg/dL      Bilirubin, UA Negative     Blood, UA Negative     RBC, UA 0 - 2 /hpf      WBC, UA 0 - 5 /hpf     Troponin I [[id number]] Collected:  10/30/17 1646    Specimen:  Blood Updated:  10/30/17 1743     Troponin I 0.04 ng/mL     Blood Culture Aerobic/Anaerobic #1 [[id number]] Collected:  10/30/17 0931    Specimen:  Arm from Blood, Intravenous Line Updated:  10/30/17 1327    Narrative:       1 BLUE+1 PURPLE    Blood Culture Aerobic/Anaerobic #2 [[id number]] Collected:  10/30/17 0932    Specimen:  Arm from Blood, Venipuncture Updated:  10/30/17 1327    Narrative:       1 BLUE+1 PURPLE    B-type Natriuretic Peptide [[id number]]  (Abnormal) Collected:  10/30/17 0928    Specimen:  Blood Updated:  10/30/17 1205     B-Natriuretic Peptide 238 (H) pg/mL  CBC with differential [[id number]]  (Abnormal) Collected:  10/30/17 0928    Specimen:  Blood from Blood Updated:  10/30/17 1104     WBC 19.51 (H) x10 3/uL      Hgb 16.3 g/dL      Hematocrit 47.8 %      Platelets 261 x10 3/uL      RBC 4.79 x10 6/uL      MCV 99.8 (H) fL      MCH 34.0 (H) pg      MCHC 34.1 g/dL      RDW 12 %      MPV 10.1 fL      Neutrophils 82.1 %      Lymphocytes Automated 3.6 %      Monocytes 13.1 %      Eosinophils Automated 0.0 %      Basophils Automated 0.3 %      Immature Granulocyte 0.9 %       Nucleated RBC 0.0 /100 WBC      Neutrophils Absolute 16.02 (H) x10 3/uL      Abs Lymph Automated 0.70 x10 3/uL      Abs Mono Automated 2.56 (H) x10 3/uL      Abs Eos Automated 0.00 x10 3/uL      Absolute Baso Automated 0.05 x10 3/uL      Absolute Immature Granulocyte 0.18 (H) x10 3/uL      Absolute NRBC 0.00 x10 3/uL             Rads:   Radiological Procedure reviewed.    Signed by: Zenovia Jarred

## 2017-10-31 NOTE — Plan of Care (Signed)
Problem: Pain  Goal: Pain at adequate level as identified by patient  Outcome: Completed Date Met: 10/31/17  Pt denies pain at this time. Will continue to monitor.     Problem: Moderate/High Fall Risk Score >5  Goal: Patient will remain free of falls  Outcome: Completed Date Met: 10/31/17  Pt is low fall risk. Pt is compliant with safety policy and staff instruction. Will continue to monitor.     Problem: Compromised Hemodynamic Status  Goal: Vital signs and fluid balance maintained/improved  Outcome: Progressing   10/31/17 1802   Goal/Interventions addressed this shift   Vital signs and fluid balance are maintained/improved Monitor/assess lab values and report abnormal values     Pt has PNA. Pt receiving PO ABX and IV steroids. Pt reports feeling relief from admitting symptoms.     Problem: Inadequate Gas Exchange  Goal: Adequate oxygenation and improved ventilation  Outcome: Progressing   10/31/17 1802   Goal/Interventions addressed this shift   Adequate oxygenation and improved ventilation Assess lung sounds     Pt has diminished at bases. Pt reported chest discomfort. VSS and telemetry showing NSR. Pt had relief after nebulizer treatment.     Problem: Impaired Mobility  Goal: Mobility/Activity is maintained at optimal level for patient  Outcome: Progressing   10/31/17 1802   Goal/Interventions addressed this shift   Mobility/activity is maintained at optimal level for patient Increase mobility as tolerated/progressive mobility     Pt ambulates around room with steady gait frequently.

## 2017-10-31 NOTE — Plan of Care (Addendum)
Problem: Safety  Goal: Patient will be free from injury during hospitalization   10/31/17 0056   Goal/Interventions addressed this shift   Patient will be free from injury during hospitalization  Assess patient's risk for falls and implement fall prevention plan of care per policy;Ensure appropriate safety devices are available at the bedside;Provide and maintain safe environment;Use appropriate transfer methods;Include patient/ family/ care giver in decisions related to safety;Hourly rounding     Goal: Patient will be free from infection during hospitalization  Outcome: Progressing   10/31/17 0056   Goal/Interventions addressed this shift   Free from Infection during hospitalization Assess and monitor for signs and symptoms of infection;Monitor lab/diagnostic results       Problem: Pain  Goal: Pain at adequate level as identified by patient  Outcome: Progressing   10/31/17 0056   Goal/Interventions addressed this shift   Pain at adequate level as identified by patient Identify patient comfort function goal;Assess for risk of opioid induced respiratory depression, including snoring/sleep apnea. Alert healthcare team of risk factors identified.;Assess pain on admission, during daily assessment and/or before any "as needed" intervention(s);Reassess pain within 30-60 minutes of any procedure/intervention, per Pain Assessment, Intervention, Reassessment (AIR) Cycle;Evaluate if patient comfort function goal is met;Evaluate patient's satisfaction with pain management progress;Offer non-pharmacological pain management interventions       Problem: Side Effects from Pain Analgesia  Goal: Patient will experience minimal side effects of analgesic therapy  Outcome: Progressing   10/31/17 0056   Goal/Interventions addressed this shift   Patient will experience minimal side effects of analgesic therapy Monitor/assess patient's respiratory status (RR depth, effort, breath sounds);Assess for changes in cognitive  function;Prevent/manage side effects per LIP orders (i.e. nausea, vomiting, pruritus, constipation, urinary retention, etc.);Evaluate for opioid-induced sedation with appropriate assessment tool (i.e. POSS)       Problem: Discharge Barriers  Goal: Patient will be discharged home or other facility with appropriate resources  Outcome: Progressing   10/31/17 0056   Goal/Interventions addressed this shift   Discharge to home or other facility with appropriate resources Provide appropriate patient education;Provide information on available health resources;Initiate discharge planning       Problem: Psychosocial and Spiritual Needs  Goal: Demonstrates ability to cope with hospitalization/illness  Outcome: Progressing   10/31/17 0056   Goal/Interventions addressed this shift   Demonstrates ability to cope with hospitalizations/illness Encourage verbalization of feelings/concerns/expectations;Provide quiet environment       Problem: Moderate/High Fall Risk Score >5  Goal: Patient will remain free of falls   10/30/17 2300   OTHER   Moderate Risk (6-13) MOD-Initiate Yellow "Fall Risk" magnet communication tool;MOD-(VH Only) Yellow slippers;MOD-Apply bed exit alarm if patient is confused;MOD-Floor mat at bedside (where available) if appropriate;MOD-Remain with patient during toileting       Problem: Compromised Hemodynamic Status  Goal: Vital signs and fluid balance maintained/improved  Outcome: Progressing   10/31/17 0056   Goal/Interventions addressed this shift   Vital signs and fluid balance are maintained/improved Position patient for maximum circulation/cardiac output;Monitor/assess vitals and hemodynamic parameters with position changes;Monitor intake and output. Notify LIP if urine output is less than 30 mL/hour.;Monitor/assess lab values and report abnormal values       Problem: Inadequate Gas Exchange  Goal: Adequate oxygenation and improved ventilation  Outcome: Progressing   10/31/17 0056   Goal/Interventions  addressed this shift   Adequate oxygenation and improved ventilation Assess lung sounds;Position for maximum ventilatory efficiency;Teach/reinforce use of incentive spirometer 10 times per hour while awake, cough and deep breath  as needed     Goal: Patent Airway maintained  Outcome: Progressing   10/31/17 0056   Goal/Interventions addressed this shift   Patent airway maintained  Position patient for maximum ventilatory efficiency       Problem: Inadequate Airway Clearance  Goal: Normal respiratory rate/effort achieved/maintained  Outcome: Progressing   10/31/17 0056   Goal/Interventions addressed this shift   Normal respiratory rate/effort achieved/maintained Plan activities to conserve energy: plan rest periods       Problem: Inadequate Tissue Perfusion-Venous  Goal: Tissue perfusion is adequate-venous  Outcome: Progressing   10/31/17 0056   Goal/Interventions addressed this shift   Tissue perfusion is adequate-venous  Increase activity as tolerated / progressive mobility       Problem: Impaired Mobility  Goal: Mobility/Activity is maintained at optimal level for patient  Outcome: Progressing   10/31/17 0056   Goal/Interventions addressed this shift   Mobility/activity is maintained at optimal level for patient Increase mobility as tolerated/progressive mobility;Encourage independent activity per ability;Plan activities to conserve energy, plan rest periods       Problem: Nutrition  Goal: Nutritional intake is adequate  Outcome: Progressing   10/31/17 0056   Goal/Interventions addressed this shift   Nutritional intake is adequate Assist patient with meals/food selection;Allow adequate time for meals;Encourage/perform oral hygiene as appropriate;Include patient/patient care companion in decisions related to nutrition       Comments: Received pt in the unit from ED for flu and pneumonia.  Transferred to bed and made comfortable.  Plan of care explained to pt and daughter. Orders implemented.  AOx4 (forgetful). Denies  CP, SOB and n/v.  Placed on droplet precaution.  Due medications given.  Tolerating diet. Voiding freely.  Pt refused troponin I to be taken, per daughter the ED nurse told them that they will [place] it. Dr. Cleatis Polka paged.  Comfort and safety measures provided.  Call bell within reach.  Bed locked and at lowest level.  Floor mat in place.  WCTM.

## 2017-10-31 NOTE — Progress Notes (Signed)
Temperature probe had difficulty taking temperature. Only able to obtain in axillary. Provided pt with warm blankets.

## 2017-10-31 NOTE — Progress Notes (Addendum)
10/31/17 0947   CM Review   Case Management Assessment Status Assessment Complete   CM Comments 5/15 acute flu, CAP, COPD, IVF, IV abx. O2. Dispo: Home     Dolores Frame, RN, BSN  [facility]-- Case Management  [phone]

## 2017-11-01 LAB — COMPREHENSIVE METABOLIC PANEL
ALT: 33 U/L (ref 0–55)
AST (SGOT): 61 U/L — ABNORMAL HIGH (ref 5–34)
Albumin/Globulin Ratio: 1 (ref 0.9–2.2)
Albumin: 2.8 g/dL — ABNORMAL LOW (ref 3.5–5.0)
Alkaline Phosphatase: 77 U/L (ref 38–106)
BUN: 12 mg/dL (ref 9.0–28.0)
Bilirubin, Total: 0.2 mg/dL (ref 0.2–1.2)
CO2: 26 mEq/L (ref 22–29)
Calcium: 8.2 mg/dL — ABNORMAL LOW (ref 8.5–10.5)
Chloride: 104 mEq/L (ref 100–111)
Creatinine: 0.7 mg/dL (ref 0.7–1.3)
Globulin: 2.7 g/dL (ref 2.0–3.6)
Glucose: 119 mg/dL — ABNORMAL HIGH (ref 70–100)
Potassium: 3.9 mEq/L (ref 3.5–5.1)
Protein, Total: 5.5 g/dL — ABNORMAL LOW (ref 6.0–8.3)
Sodium: 136 mEq/L (ref 136–145)

## 2017-11-01 LAB — CBC AND DIFFERENTIAL
Absolute NRBC: 0 10*3/uL (ref 0.00–0.00)
Basophils Absolute Automated: 0 10*3/uL (ref 0.00–0.08)
Basophils Automated: 0 %
Eosinophils Absolute Automated: 0 10*3/uL (ref 0.00–0.44)
Eosinophils Automated: 0 %
Hematocrit: 38.3 % (ref 37.6–49.6)
Hgb: 13.1 g/dL (ref 12.5–17.1)
Immature Granulocytes Absolute: 0.14 10*3/uL — ABNORMAL HIGH (ref 0.00–0.07)
Immature Granulocytes: 1 %
Lymphocytes Absolute Automated: 0.65 10*3/uL (ref 0.42–3.22)
Lymphocytes Automated: 4.6 %
MCH: 33.9 pg — ABNORMAL HIGH (ref 25.1–33.5)
MCHC: 34.2 g/dL (ref 31.5–35.8)
MCV: 99 fL — ABNORMAL HIGH (ref 78.0–96.0)
MPV: 10.5 fL (ref 8.9–12.5)
Monocytes Absolute Automated: 0.79 10*3/uL (ref 0.21–0.85)
Monocytes: 5.6 %
Neutrophils Absolute: 12.65 10*3/uL — ABNORMAL HIGH (ref 1.10–6.33)
Neutrophils: 88.8 %
Nucleated RBC: 0 /100 WBC (ref 0.0–0.0)
Platelets: 239 10*3/uL (ref 142–346)
RBC: 3.87 10*6/uL — ABNORMAL LOW (ref 4.20–5.90)
RDW: 13 % (ref 11–15)
WBC: 14.23 10*3/uL — ABNORMAL HIGH (ref 3.10–9.50)

## 2017-11-01 LAB — GFR: EGFR: >60

## 2017-11-01 MED ORDER — METHYLPREDNISOLONE SODIUM SUCC 40 MG IJ SOLR
40.00 mg | Freq: Two times a day (BID) | INTRAMUSCULAR | Status: AC
Start: 2017-11-02 — End: 2017-11-02
  Administered 2017-11-02: 01:00:00 40 mg via INTRAVENOUS
  Filled 2017-11-01: qty 1

## 2017-11-01 MED ORDER — PREDNISONE 20 MG PO TABS
40.00 mg | ORAL_TABLET | Freq: Every morning | ORAL | Status: DC
Start: 2017-11-02 — End: 2017-11-02
  Administered 2017-11-02: 10:00:00 40 mg via ORAL
  Filled 2017-11-01: qty 2

## 2017-11-01 NOTE — Plan of Care (Signed)
Problem: Safety  Goal: Patient will be free from injury during hospitalization  Outcome: Progressing   10/31/17 2337   Goal/Interventions addressed this shift   Patient will be free from injury during hospitalization  Assess patient's risk for falls and implement fall prevention plan of care per policy;Provide and maintain safe environment;Use appropriate transfer methods;Ensure appropriate safety devices are available at the bedside;Include patient/ family/ care giver in decisions related to safety;Hourly rounding;Assess for patients risk for elopement and implement Elopement Risk Plan per policy;Provide alternative method of communication if needed (communication boards, writing)       Problem: Side Effects from Pain Analgesia  Goal: Patient will experience minimal side effects of analgesic therapy  Outcome: Progressing   10/31/17 0056   Goal/Interventions addressed this shift   Patient will experience minimal side effects of analgesic therapy Monitor/assess patient's respiratory status (RR depth, effort, breath sounds);Assess for changes in cognitive function;Prevent/manage side effects per LIP orders (i.e. nausea, vomiting, pruritus, constipation, urinary retention, etc.);Evaluate for opioid-induced sedation with appropriate assessment tool (i.e. POSS)       Problem: Discharge Barriers  Goal: Patient will be discharged home or other facility with appropriate resources  Outcome: Progressing   10/31/17 0056   Goal/Interventions addressed this shift   Discharge to home or other facility with appropriate resources Provide appropriate patient education;Provide information on available health resources;Initiate discharge planning       Problem: Compromised Hemodynamic Status  Goal: Vital signs and fluid balance maintained/improved  Outcome: Progressing   10/31/17 2337   Goal/Interventions addressed this shift   Vital signs and fluid balance are maintained/improved Position patient for maximum circulation/cardiac  output;Monitor/assess vitals and hemodynamic parameters with position changes;Monitor/assess lab values and report abnormal values;Monitor intake and output. Notify LIP if urine output is less than 30 mL/hour.       Problem: Inadequate Gas Exchange  Goal: Adequate oxygenation and improved ventilation  Outcome: Progressing   10/31/17 2337   Goal/Interventions addressed this shift   Adequate oxygenation and improved ventilation Assess lung sounds;Monitor SpO2 and treat as needed;Provide mechanical and oxygen support to facilitate gas exchange;Position for maximum ventilatory efficiency;Teach/reinforce use of incentive spirometer 10 times per hour while awake, cough and deep breath as needed;Plan activities to conserve energy: plan rest periods;Consult/collaborate with Respiratory Therapy;Increase activity as tolerated/progressive mobility       Problem: Inadequate Airway Clearance  Goal: Normal respiratory rate/effort achieved/maintained  Outcome: Progressing   10/31/17 2337   Goal/Interventions addressed this shift   Normal respiratory rate/effort achieved/maintained Plan activities to conserve energy: plan rest periods       Comments: A&O x4, VSS, no complaints of pain, Droplet precautions in place.   Tolerating PO intake, no N/V. Ambulates OOB independently in room.   Abx changed to PO, Pt tolerating well. [place] today vs. Tomorrow; MD rounds pending.

## 2017-11-01 NOTE — Progress Notes (Signed)
Progress note     Date Time: 11/01/17 12:57 PM  Patient Name: Gary Hunter,Gary GABEL JR.  Attending Physician: Zenovia Jarred, MD    Assessment:   Acute influenza   CAP   Copd exacerbation       Plan:   Admit to inpatient     1. Acute influenza   tamiflu  - resume same .   Supportive care     2. Cad - levaquin   Follow cx     3. Copd exacerbation -   Nebs   Steroids  - change to prednisone tomorrow .    Start breo .      dispo - [place] home tomorrow     dvt prophylaxis       History of Present Illness:   Gary Hunter. is a 62 y.o. male who presents to the hospital with COPD, recent pneumonia; BIBA who presents with SOB.     Pt with worsening cough for the past few days. He started having SOB this morning and appeared mildly confused by family, which prompted 911 call. EMS noted pt O2 sats 88% on RA, placed on 4L O2 with improvement to 96%. Febrile to 100.61F en route. Pt states he feels his symptoms are similar to previous pneumonia. Took Tylenol this morning (does not remember what time). Denies chest pain.     Per daughter, pt took a 36 hr bus ride 3 days ago from [place] and started coughing after that .   Notes was treated for pneumonia by PCP 1 month ago . Does not recall abx .   5/15 - feels better  , brathing improving .     5/16 - breathing improving   , cough better , occ DOE .         Past Medical History:     Past Medical History:   Diagnosis Date   . Chronic obstructive pulmonary disease    . Pneumonia        Past Surgical History:   History reviewed. No pertinent surgical history.    Family History:   History reviewed. No pertinent family history.    Social History:     Social History     Social History   . Marital status: Married     Spouse name: N/A   . Number of children: N/A   . Years of education: N/A     Social History Main Topics   . Smoking status: Current Every Day Smoker     Packs/day: 1.00     Types: Cigarettes   . Smokeless tobacco: Never Used   . Alcohol use Yes      Comment: drinks beer socially    . Drug use: No   . Sexual activity: Not on file     Other Topics Concern   . Not on file     Social History Narrative   . No narrative on file       Allergies:   No Known Allergies    Medications:     Prescriptions Prior to Admission   Medication Sig   . albuterol (PROVENTIL HFA;VENTOLIN HFA) 108 (90 Base) MCG/ACT inhaler Inhale 2 puffs into the lungs 3 (three) times daily as needed for Wheezing       Review of Systems:   A comprehensive review of systems was: Negative except hpi   History obtained from chart review and the patient  Respiratory ROS: positive for - cough and shortness of breath  Cardiovascular ROS: positive for - chest pain and dyspnea on exertion  Gastrointestinal ROS: no abdominal pain, change in bowel habits, or black or bloody stools  Genito-Urinary ROS: no dysuria, trouble voiding, or hematuria  Musculoskeletal ROS: negative  Neurological ROS: no TIA or stroke symptoms    Physical Exam:     Vitals:    11/01/17 1204   BP: 117/69   Pulse: 78   Resp: 17   Temp: (!) 96 F (35.6 C)   SpO2: 95%       Intake and Output Summary (Last 24 hours) at Date Time    Intake/Output Summary (Last 24 hours) at 11/01/17 1257  Last data filed at 10/31/17 1800   Gross per 24 hour   Intake             1500 ml   Output                0 ml   Net             1500 ml       General appearance - alert, well appearing, improved   Mental status - alert, oriented to person, place, and time  Eyes - pupils equal and reactive, extraocular eye movements intact  Chest -  diminished - better air entry mild wheezing , much improved   Heart - normal rate and regular rhythm  Abdomen - soft, nontender, nondistended, no masses or organomegaly  Neurological - cranial nerves II through XII intact  Musculoskeletal - 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      Labs:     Results     Procedure Component Value Units Date/Time    Comprehensive metabolic panel [[id number]]  (Abnormal) Collected:  11/01/17 0445    Specimen:  Blood Updated:   11/01/17 0551     Glucose 119 (H) mg/dL      BUN 40.9 mg/dL      Creatinine 0.7 mg/dL      Sodium 811 mEq/L      Potassium 3.9 mEq/L      Chloride 104 mEq/L      CO2 26 mEq/L      Calcium 8.2 (L) mg/dL      Protein, Total 5.5 (L) g/dL      Albumin 2.8 (L) g/dL      AST (SGOT) 61 (H) U/L      ALT 33 U/L      Alkaline Phosphatase 77 U/L      Bilirubin, Total 0.2 mg/dL      Globulin 2.7 g/dL      Albumin/Globulin Ratio 1.0    GFR [[id number]] Collected:  11/01/17 0445     Updated:  11/01/17 0551     EGFR >60.0    CBC and differential [[id number]]  (Abnormal) Collected:  11/01/17 0445    Specimen:  Blood from Blood Updated:  11/01/17 0514     WBC 14.23 (H) x10 3/uL      Hgb 13.1 g/dL      Hematocrit 46.9 %      Platelets 239 x10 3/uL      RBC 3.87 (L) x10 6/uL      MCV 99.0 (H) fL      MCH 33.9 (H) pg      MCHC 34.2 g/dL      RDW 13 %      MPV 10.5 fL      Neutrophils 88.8 %      Lymphocytes Automated 4.6 %  Monocytes 5.6 %      Eosinophils Automated 0.0 %      Basophils Automated 0.0 %      Immature Granulocyte 1.0 %      Nucleated RBC 0.0 /100 WBC      Neutrophils Absolute 12.65 (H) x10 3/uL      Abs Lymph Automated 0.65 x10 3/uL      Abs Mono Automated 0.79 x10 3/uL      Abs Eos Automated 0.00 x10 3/uL      Absolute Baso Automated 0.00 x10 3/uL      Absolute Immature Granulocyte 0.14 (H) x10 3/uL      Absolute NRBC 0.00 x10 3/uL     Urine culture [[id number]] Collected:  10/30/17 1956    Specimen:  Urine from Urine, Clean Catch Updated:  10/31/17 1804    Narrative:       Replace urinary catheter prior to obtaining the urine culture  if it has been in place for greater than or equal to 14  days:->N/A No Foley  Indications for Urine Culture:->Suprapubic Pain/Tenderness or  Dysuria  ORDER#: [id number]                                    ORDERED BY: HASAN, ALI  SOURCE: Urine, Clean Catch                           COLLECTED:  10/30/17 19:56  ANTIBIOTICS AT COLL.:                                RECEIVED :  10/30/17  22:49  Culture Urine                              FINAL       10/31/17 18:04  10/31/17   No growth of >1,000 CFU/ML, No further work      Blood Culture Aerobic/Anaerobic #1 [[id number]] Collected:  10/30/17 0931    Specimen:  Arm from Blood, Intravenous Line Updated:  10/31/17 1421    Narrative:       ORDER#: [id number]                                    ORDERED BY: Haywood Pao, ALI  SOURCE: Blood, Intravenous Line SteriPath            COLLECTED:  10/30/17 09:31  ANTIBIOTICS AT COLL.:                                RECEIVED :  10/30/17 13:27  Culture Blood Aerobic and Anaerobic        PRELIM      10/31/17 14:21  10/31/17   No Growth after 1 day/s of incubation.      Blood Culture Aerobic/Anaerobic #2 [[id number]] Collected:  10/30/17 0932    Specimen:  Arm from Blood, Venipuncture Updated:  10/31/17 1421    Narrative:       ORDER#: [id number]  ORDERED BY: HASAN, ALI  SOURCE: Blood, Venipuncture SteriPath                COLLECTED:  10/30/17 09:32  ANTIBIOTICS AT COLL.:                                RECEIVED :  10/30/17 13:27  Culture Blood Aerobic and Anaerobic        PRELIM      10/31/17 14:21  10/31/17   No Growth after 1 day/s of incubation.              Rads:   Radiological Procedure reviewed.    Signed by: Zenovia Jarred

## 2017-11-02 LAB — CBC AND DIFFERENTIAL
Absolute NRBC: 0 10*3/uL (ref 0.00–0.00)
Basophils Absolute Automated: 0.01 10*3/uL (ref 0.00–0.08)
Basophils Automated: 0.1 %
Eosinophils Absolute Automated: 0 10*3/uL (ref 0.00–0.44)
Eosinophils Automated: 0 %
Hematocrit: 37.9 % (ref 37.6–49.6)
Hgb: 12.7 g/dL (ref 12.5–17.1)
Immature Granulocytes Absolute: 0.04 10*3/uL (ref 0.00–0.07)
Immature Granulocytes: 0.4 %
Lymphocytes Absolute Automated: 0.55 10*3/uL (ref 0.42–3.22)
Lymphocytes Automated: 5.1 %
MCH: 33.4 pg (ref 25.1–33.5)
MCHC: 33.5 g/dL (ref 31.5–35.8)
MCV: 99.7 fL — ABNORMAL HIGH (ref 78.0–96.0)
MPV: 10.4 fL (ref 8.9–12.5)
Monocytes Absolute Automated: 0.74 10*3/uL (ref 0.21–0.85)
Monocytes: 6.9 %
Neutrophils Absolute: 9.34 10*3/uL — ABNORMAL HIGH (ref 1.10–6.33)
Neutrophils: 87.5 %
Nucleated RBC: 0 /100 WBC (ref 0.0–0.0)
Platelets: 234 10*3/uL (ref 142–346)
RBC: 3.8 10*6/uL — ABNORMAL LOW (ref 4.20–5.90)
RDW: 13 % (ref 11–15)
WBC: 10.68 10*3/uL — ABNORMAL HIGH (ref 3.10–9.50)

## 2017-11-02 LAB — COMPREHENSIVE METABOLIC PANEL
ALT: 38 U/L (ref 0–55)
AST (SGOT): 50 U/L — ABNORMAL HIGH (ref 5–34)
Albumin/Globulin Ratio: 1 (ref 0.9–2.2)
Albumin: 2.9 g/dL — ABNORMAL LOW (ref 3.5–5.0)
Alkaline Phosphatase: 77 U/L (ref 38–106)
BUN: 12 mg/dL (ref 9.0–28.0)
Bilirubin, Total: 0.3 mg/dL (ref 0.2–1.2)
CO2: 28 mEq/L (ref 22–29)
Calcium: 8.6 mg/dL (ref 8.5–10.5)
Chloride: 102 mEq/L (ref 100–111)
Creatinine: 0.7 mg/dL (ref 0.7–1.3)
Globulin: 2.9 g/dL (ref 2.0–3.6)
Glucose: 103 mg/dL — ABNORMAL HIGH (ref 70–100)
Potassium: 3.5 mEq/L (ref 3.5–5.1)
Protein, Total: 5.8 g/dL — ABNORMAL LOW (ref 6.0–8.3)
Sodium: 136 mEq/L (ref 136–145)

## 2017-11-02 LAB — GFR: EGFR: >60

## 2017-11-02 MED ORDER — LEVOFLOXACIN 750 MG PO TABS
750.00 mg | ORAL_TABLET | Freq: Every day | ORAL | 0 refills | Status: AC
Start: 2017-11-03 — End: 2017-11-05

## 2017-11-02 MED ORDER — OSELTAMIVIR PHOSPHATE 75 MG PO CAPS
75.00 mg | ORAL_CAPSULE | Freq: Two times a day (BID) | ORAL | 0 refills | Status: AC
Start: 2017-11-02 — End: ?

## 2017-11-02 MED ORDER — FLUTICASONE FUROATE-VILANTEROL 200-25 MCG/INH IN AEPB
1.00 | INHALATION_SPRAY | Freq: Every day | RESPIRATORY_TRACT | 0 refills | Status: AC
Start: 2017-11-02 — End: ?

## 2017-11-02 MED ORDER — PREDNISONE 10 MG PO TABS
ORAL_TABLET | ORAL | 0 refills | Status: AC
Start: 2017-11-02 — End: ?

## 2017-11-02 MED ORDER — ALBUTEROL-IPRATROPIUM 2.5-0.5 (3) MG/3ML IN SOLN
3.00 mL | Freq: Two times a day (BID) | RESPIRATORY_TRACT | Status: DC
Start: 2017-11-02 — End: 2017-11-02

## 2017-11-02 MED ORDER — ALBUTEROL SULFATE HFA 108 (90 BASE) MCG/ACT IN AERS
2.0000 | INHALATION_SPRAY | Freq: Three times a day (TID) | RESPIRATORY_TRACT | 1 refills | Status: AC | PRN
Start: 2017-11-02 — End: ?

## 2017-11-02 NOTE — Discharge Instr - AVS First Page (Signed)
Reason for your Hospital Admission:  Copd exacerbation  Acute influenza       Instructions for after your discharge:  Resume meds   Stop smoking

## 2017-11-02 NOTE — Progress Notes (Signed)
Discharge Planning---Case Management     11/02/17 1626   Discharge Disposition   Patient preference/choice provided? N/A   Physical Discharge Disposition Home   Mode of Transportation Car   Patient/Family/POA notified of transfer plan Yes   Patient agreeable to discharge plan/expected d/c date? Yes   Family/POA agreeable to discharge plan/expected d/c date? Yes   Bedside nurse notified of transport plan? Yes   CM Interventions   Follow up appointment scheduled? Yes   Multidisciplinary rounds/family meeting before d/c? Yes   Medicare Checklist   Is this a Medicare patient? No       Dolores Frame, RN, BSN  [facility]-- Case Management  [phone]

## 2017-11-02 NOTE — Plan of Care (Signed)
Problem: Safety  Goal: Patient will be free from injury during hospitalization  Outcome: Progressing   10/31/17 2337   Goal/Interventions addressed this shift   Patient will be free from injury during hospitalization  Assess patient's risk for falls and implement fall prevention plan of care per policy;Provide and maintain safe environment;Use appropriate transfer methods;Ensure appropriate safety devices are available at the bedside;Include patient/ family/ care giver in decisions related to safety;Hourly rounding;Assess for patients risk for elopement and implement Elopement Risk Plan per policy;Provide alternative method of communication if needed (communication boards, writing)       Problem: Compromised Hemodynamic Status  Goal: Vital signs and fluid balance maintained/improved  Outcome: Progressing   10/31/17 2337   Goal/Interventions addressed this shift   Vital signs and fluid balance are maintained/improved Position patient for maximum circulation/cardiac output;Monitor/assess vitals and hemodynamic parameters with position changes;Monitor/assess lab values and report abnormal values;Monitor intake and output. Notify LIP if urine output is less than 30 mL/hour.       Problem: Inadequate Gas Exchange  Goal: Adequate oxygenation and improved ventilation  Outcome: Progressing   10/31/17 2337   Goal/Interventions addressed this shift   Adequate oxygenation and improved ventilation Assess lung sounds;Monitor SpO2 and treat as needed;Provide mechanical and oxygen support to facilitate gas exchange;Position for maximum ventilatory efficiency;Teach/reinforce use of incentive spirometer 10 times per hour while awake, cough and deep breath as needed;Plan activities to conserve energy: plan rest periods;Consult/collaborate with Respiratory Therapy;Increase activity as tolerated/progressive mobility     Goal: Patent Airway maintained  Outcome: Progressing   10/31/17 0056   Goal/Interventions addressed this shift    Patent airway maintained  Position patient for maximum ventilatory efficiency       Problem: Inadequate Airway Clearance  Goal: Normal respiratory rate/effort achieved/maintained  Outcome: Progressing   10/31/17 2337   Goal/Interventions addressed this shift   Normal respiratory rate/effort achieved/maintained Plan activities to conserve energy: plan rest periods       Problem: Inadequate Tissue Perfusion-Venous  Goal: Tissue perfusion is adequate-venous  Outcome: Progressing   10/31/17 2337   Goal/Interventions addressed this shift   Tissue perfusion is adequate-venous  Increase activity as tolerated / progressive mobility;Teach/review/reinforce ankle pump exercises;VTE prevention: Administer anticoagulant(s) and/or apply anti-embolism stockings/devices as ordered;Elevate feet when in chair       Problem: Impaired Mobility  Goal: Mobility/Activity is maintained at optimal level for patient  Outcome: Progressing   10/31/17 2337   Goal/Interventions addressed this shift   Mobility/activity is maintained at optimal level for patient Increase mobility as tolerated/progressive mobility;Encourage independent activity per ability;Maintain proper body alignment;Perform active/passive ROM;Plan activities to conserve energy, plan rest periods;Reposition patient every 2 hours and as needed unless able to reposition self;Assess for changes in respiratory status, level of consciousness and/or development of fatigue       Problem: Nutrition  Goal: Nutritional intake is adequate  Outcome: Progressing   10/31/17 2337   Goal/Interventions addressed this shift   Nutritional intake is adequate Allow adequate time for meals;Include patient/patient care companion in decisions related to nutrition       Comments: A/O x4. VSS. Denies pain/SOB/nausea  On Droplet precautions for influenza/pnemonia   Ambulatory. Medications administered per [facility]  Resting comfortably  Expected to be d/c in the AM  Call bell within reach. WCTM

## 2017-11-02 NOTE — Discharge Summary (Signed)
DISCHARGE NOTE    Date Time: 11/02/17 3:28 PM  Patient Name: Gary Hunter,Gary GABEL JR.  Attending Physician: Zenovia Jarred, MD    Date of Admission:   10/30/2017    Date of Discharge:   11/02/17    Reason for Admission:   Influenza A [J10.1]  Pneumonia of both lungs due to infectious organism, unspecified part of lung [J18.9]  Chronic obstructive pulmonary disease, unspecified COPD type [J44.9]  Pneumonia [J18.9]    Problems:   Lists the present on admission hospital problems  Present on Admission:  . Pneumonia      Hospital Problems:  Active Problems:    Pneumonia      Problem Lists:  Patient Active Problem List   Diagnosis   . Pneumonia              Discharge Dx:   Acute influenza   CAP   Copd exacerbation     Consultations:       Procedures performed:       Hospital Course:     Berley Gambrell. is a 62 y.o. male who presents to the hospital with COPD, recent pneumonia; BIBA who presents with SOB    1. Acute influenza   tamiflu  - resume same . At [place]   Supportive care     2. Cad - levaquin   Finish after 2 m ore day s       3. Copd exacerbation -   Nebs   Steroids  - change to prednisone and taper at [employer] breo .    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

## 2017-11-02 NOTE — Plan of Care (Signed)
Problem: Discharge Barriers  Goal: Patient will be discharged home or other facility with appropriate resources  Outcome: Progressing   11/02/17 0854   Goal/Interventions addressed this shift   Discharge to home or other facility with appropriate resources Provide appropriate patient education;Provide information on available health resources;Initiate discharge planning       Problem: Inadequate Gas Exchange  Goal: Adequate oxygenation and improved ventilation  Outcome: Progressing   11/02/17 0854   Goal/Interventions addressed this shift   Adequate oxygenation and improved ventilation Assess lung sounds;Monitor SpO2 and treat as needed;Provide mechanical and oxygen support to facilitate gas exchange;Position for maximum ventilatory efficiency;Teach/reinforce use of incentive spirometer 10 times per hour while awake, cough and deep breath as needed;Plan activities to conserve energy: plan rest periods

## 2017-11-02 NOTE — Discharge Summary -  Nursing (Signed)
Patient discharged from facility in stable condition. He received all hard copies of his prescriptions he states he will get them filled when he returns to [place]. RN reviewed all discharge instructions with patient. Patient stated that he understood all instructions. PIV removed. Patient left facility via wheelchair accompanied by his daughter.

## 2019-10-01 IMAGING — MR MRI BRAIN W/WO CONTRAST
11 series · 48 of 48 positions shown · IV contrast (prohance)
Comparison: None.

HISTORY: Malignant neoplasm of unspecified part of unspecified bronchus or lung; pulmonary embolism without acute cor pulmonale
TECHNIQUE: Multiplanar, multisequential MRI images of the brain are obtained prior to and following 15 mL ProHance intravenous contrast.

[Series 5: flair_axial_fs · axial · 4.0mm · 0.75mm/px · z∈[-68,+95]mm · 2 of 33 slices shown]
[im 1/33]
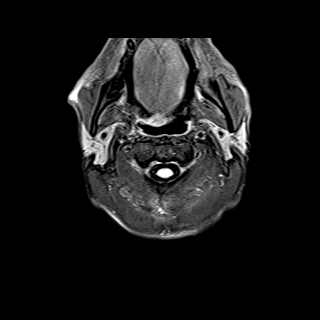
[im 33/33]
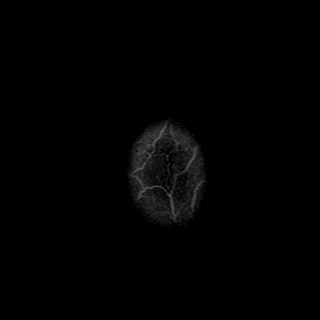

[Series 6: t2_axial · axial · 4.0mm · 0.38mm/px · z∈[-68,+95]mm · 2 of 33 slices shown]
[im 1/33]
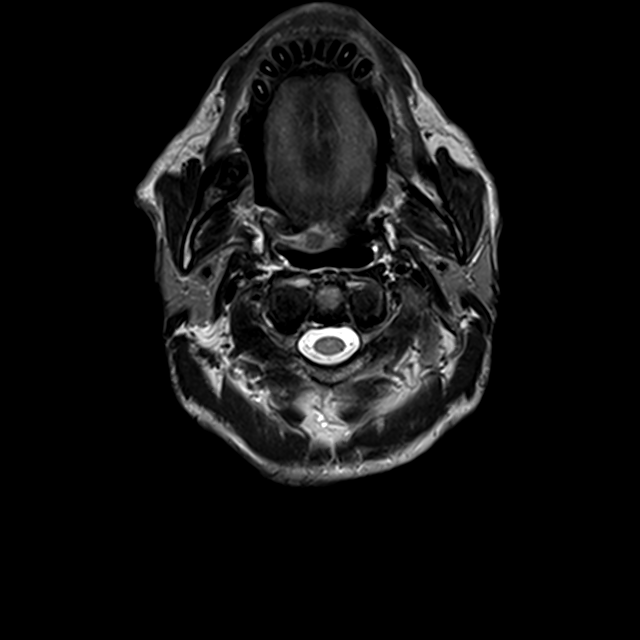
[im 33/33]
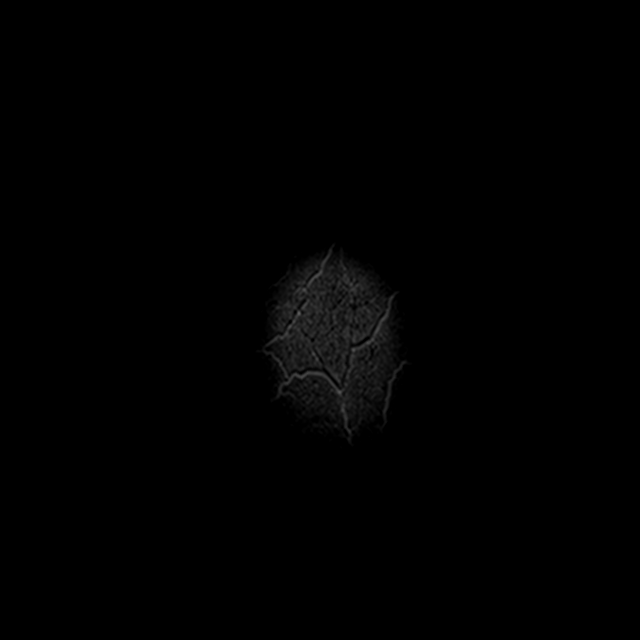

[Series 7: DWI · axial · 4.0mm · 0.86mm/px · z∈[-50,+82]mm · 2 of 27 slices shown (1 of 2)]
[im 1/27]
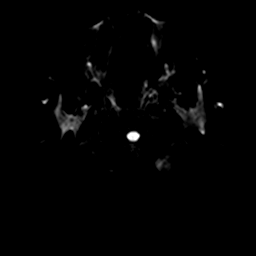
[im 27/27]
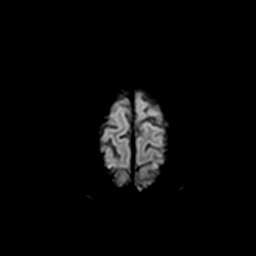

[Series 8: DWI · axial · 4.0mm · 0.86mm/px · 1 of 27 slices shown (2 of 2)]
[im 1/27]
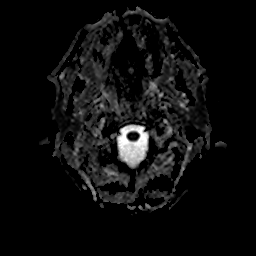

[Series 9: flash_axial · axial · 4.0mm · 0.75mm/px · 1 of 33 slices shown]
[im 1/33]
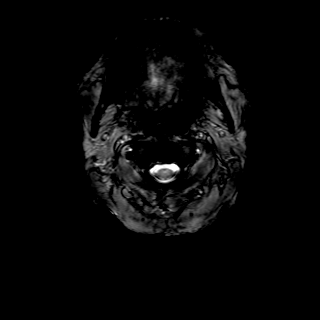

[Series 10: t1_mpr_axial_pre · axial · 1.0mm · 0.90mm/px · z∈[-68,+103]mm · 6 of 176 slices shown]
[im 1/176]
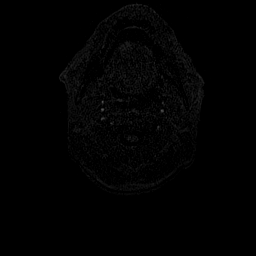
[im 36/176]
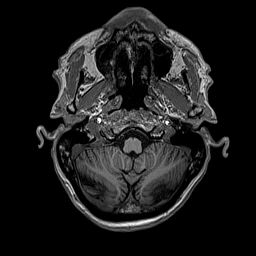
[im 71/176]
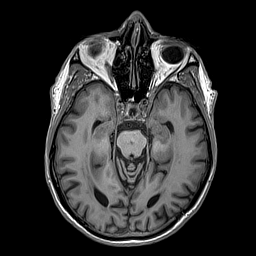
[im 106/176]
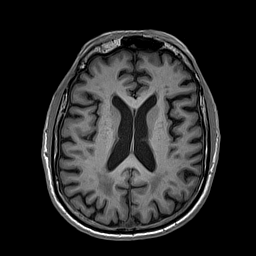
[im 141/176]
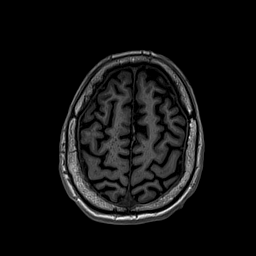
[im 176/176]
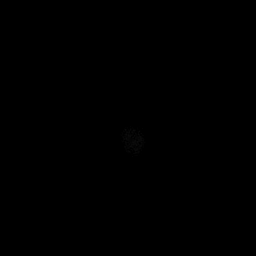

[Series 11: t1_mpr_axial_pre_mpr_sag · sagittal · 1.0mm · 0.90mm/px · 7 of 200 slices shown]
[im 1/200]
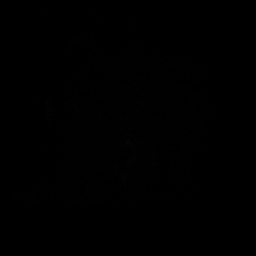
[im 34/200]
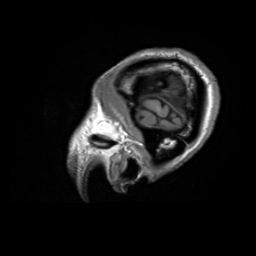
[im 67/200]
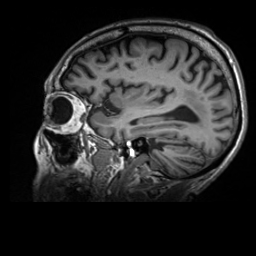
[im 100/200]
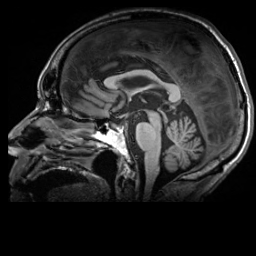
[im 133/200]
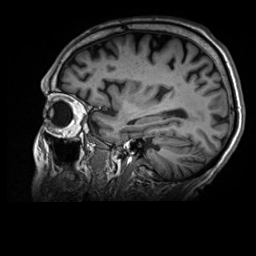
[im 166/200]
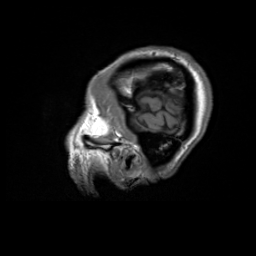
[im 200/200]
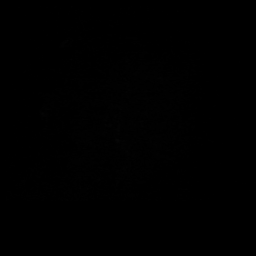

[Series 12: t1_mpr_axial_pre_mpr_cor · coronal · 1.0mm · 0.90mm/px · 7 of 200 slices shown]
[im 1/200]
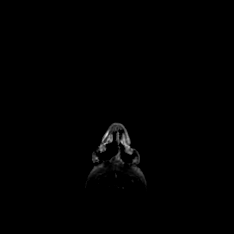
[im 34/200]
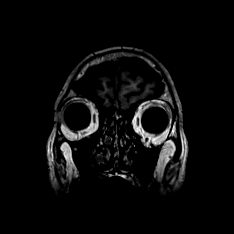
[im 67/200]
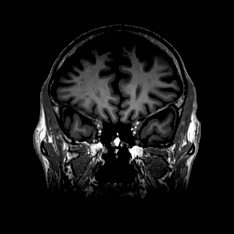
[im 100/200]
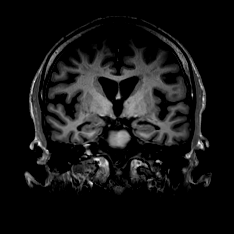
[im 133/200]
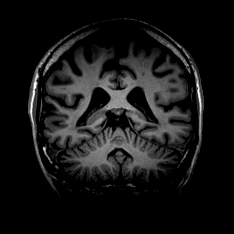
[im 166/200]
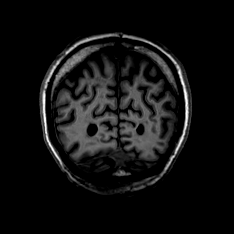
[im 200/200]
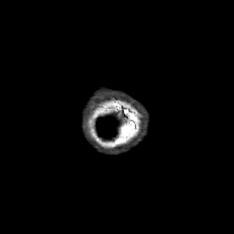

[Series 13: t1_mpr_axial+c · axial · 1.0mm · 0.90mm/px · z∈[-68,+103]mm · 6 of 176 slices shown]
[im 1/176]
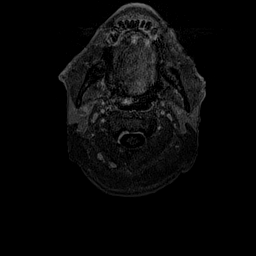
[im 36/176]
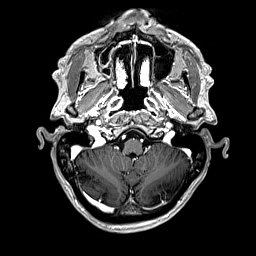
[im 71/176]
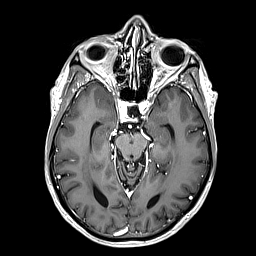
[im 106/176]
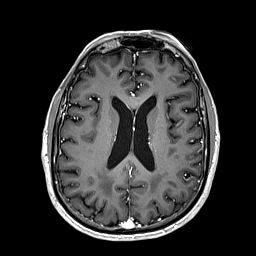
[im 141/176]
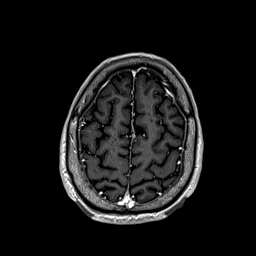
[im 176/176]
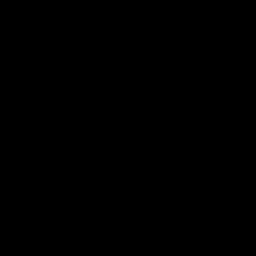

[Series 14: t1_mpr_axial+c_mpr_sag · sagittal · 1.0mm · 0.90mm/px · 7 of 200 slices shown]
[im 1/200]
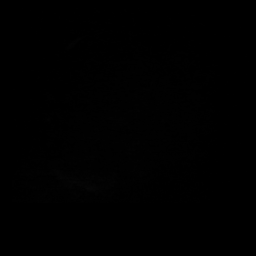
[im 34/200]
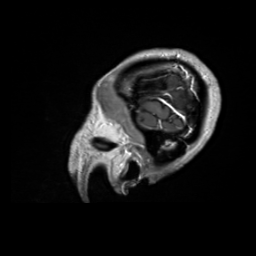
[im 67/200]
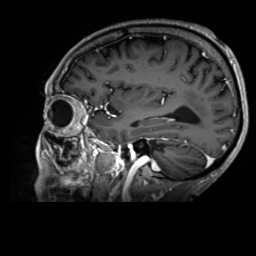
[im 100/200]
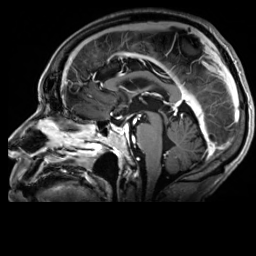
[im 133/200]
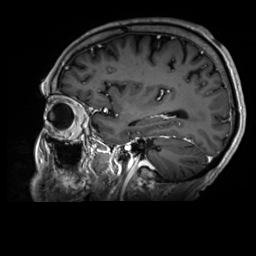
[im 166/200]
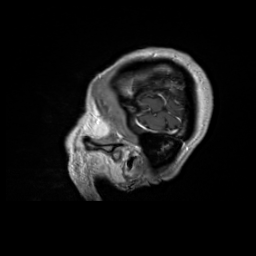
[im 200/200]
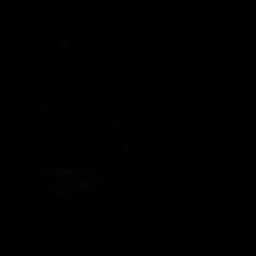

[Series 15: t1_mpr_axial+c_mpr_cor · coronal · 1.0mm · 0.90mm/px · 7 of 199 slices shown]
[im 1/199]
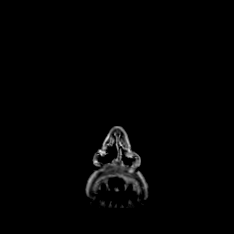
[im 34/199]
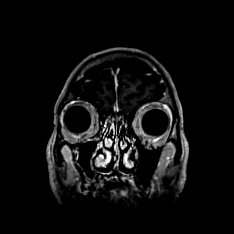
[im 67/199]
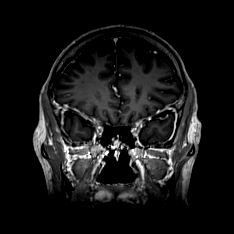
[im 100/199]
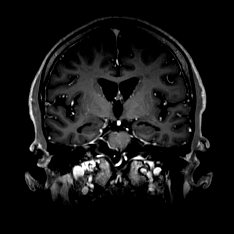
[im 133/199]
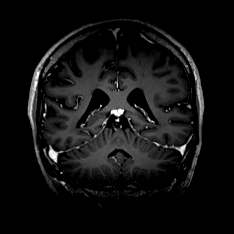
[im 166/199]
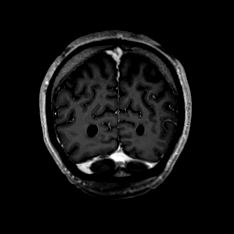
[im 199/199]
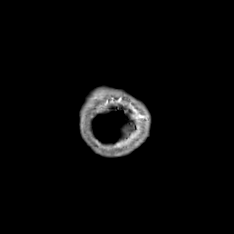

[48 of 48 positions shown; findings below may reference images not displayed]

FINDINGS: There is prominence of ventricles and cortical sulci. Periventricular, subcortical, to lesser degree pontine FLAIR and T2 hyperintensity foci seen. Incidentally noted 1 cm simple pineal gland cyst seen. Midline structures otherwise normal. Flow voids of main intracranial vasculature are normal. There is no mass lesion or midline shift. No extra-axial fluid collection identified. No area of restricted diffusion, hemosiderin deposit or abnormal enhancement seen. Mucosal thickening of ethmoid air cells and frontal sinuses identified. Mucosal thickening of maxillary sinuses with air-fluid level of right maxillary sinus seen. Fluid opacified mastoid air cells identified bilaterally, more prominent on the right. The rest of the orbits, paranasal sinuses, mastoid air cells and skull marrow signal are normal. Appearance of thinning of scalp soft tissue of bilateral parietal scalp seen on image 13-36.
IMPRESSION: Mild cerebral atrophy or involutional changes with mild chronic small vessel ischemic changes seen.

Chronic and acute paranasal sinusitis seen.

No metastasis to brain or skull seen.

Appearance of thinning of parietal scalp identified bilaterally, artifactual or due to previous soft tissue injury.

Stat fax

## 2019-10-13 IMAGING — PT PET CT SKULL BASE TO THIGH_STAGING
3 series · 25 of 25 positions shown · non-contrast
Comparison: Report of CTA of chest, 05/10/2019, performed at [facility] [HOSPITAL].

Height: 74 inches. Weight: 129.0 pounds.
INDICATION: Staging of bronchogenic carcinoma diagnosed on August 2019.
TECHNIQUE: The patient's serum glucose was 77 mg/dL at time of the study.  The patient was intravenously injected with 11.7 mCi F-18 Fluorodeoxyglucose in a left antecubital vein. The patient rested quietly for 65 minutes and then received attenuation corrected PET/CT imaging with Time of Flight Protocol from the skull base to mid thigh. PET, CT, and fused images were reviewed at the reading station. SUV is corrected based on lean body mass. Images are adequate for review.

[Series 3: pet ac · axial · 5.0mm · 4.07mm/px · z∈[-979,-22]mm · 11 of 320 slices shown]
[im 1/320]
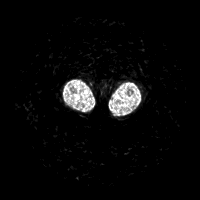
[im 32/320]
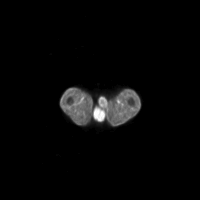
[im 64/320]
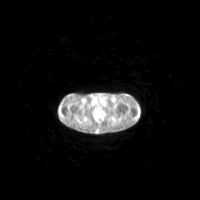
[im 96/320]
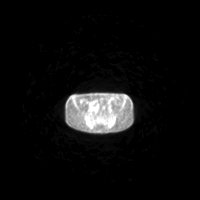
[im 128/320]
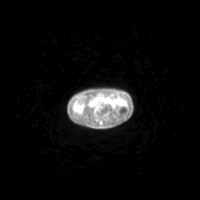
[im 160/320]
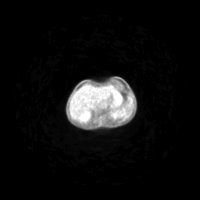
[im 192/320]
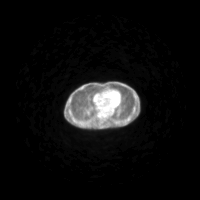
[im 224/320]
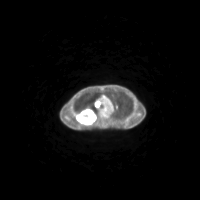
[im 256/320]
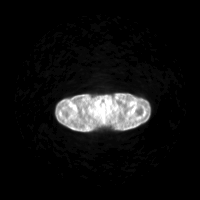
[im 288/320]
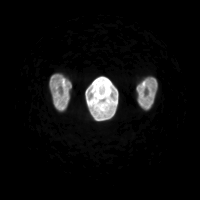
[im 320/320]
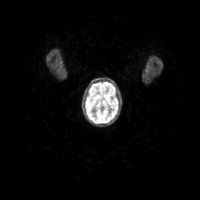

[Series 4: ct pet 4.0 b30f · axial · 4.0mm · 0.98mm/px · z∈[-979,-22]mm · 12 of 319 slices shown]
[im 1/319]
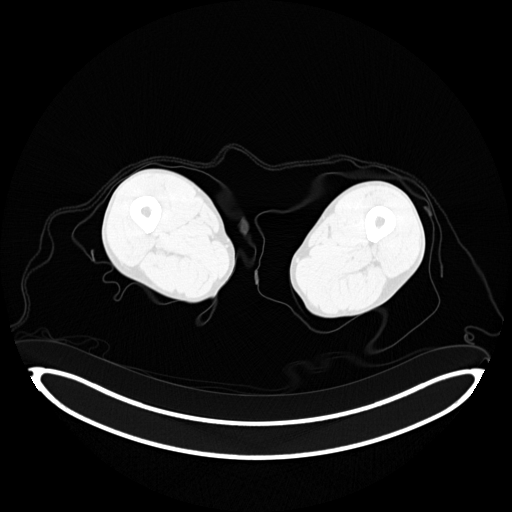
[im 29/319]
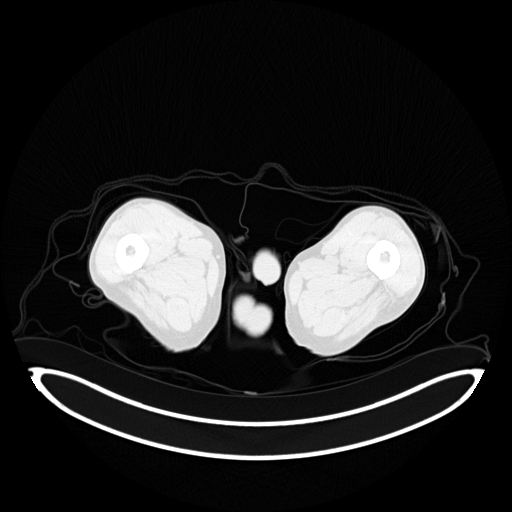
[im 58/319]
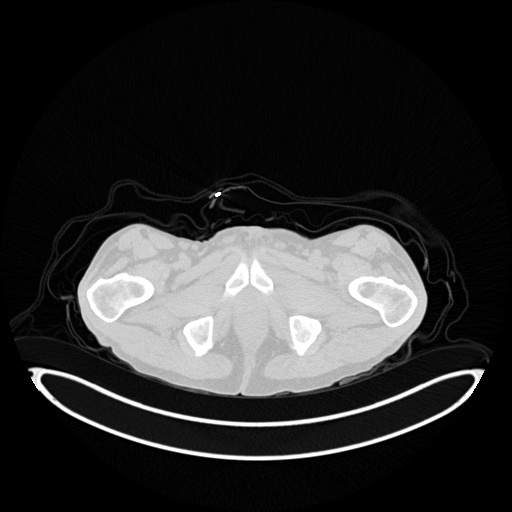
[im 87/319]
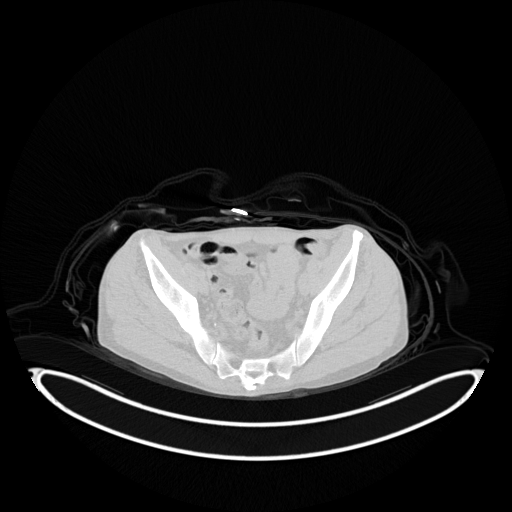
[im 116/319]
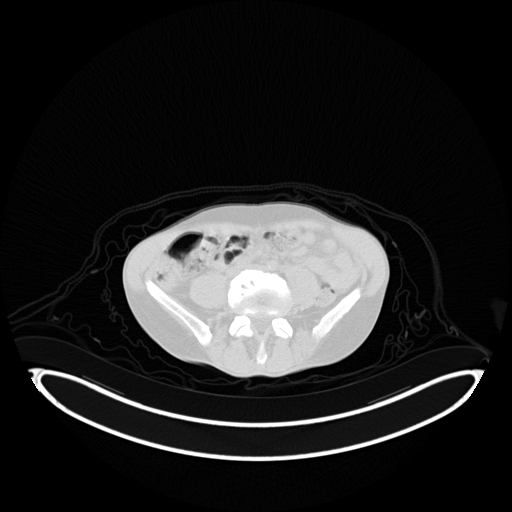
[im 145/319]
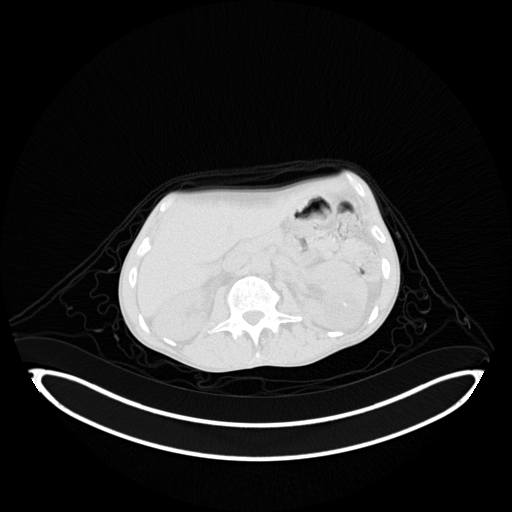
[im 174/319]
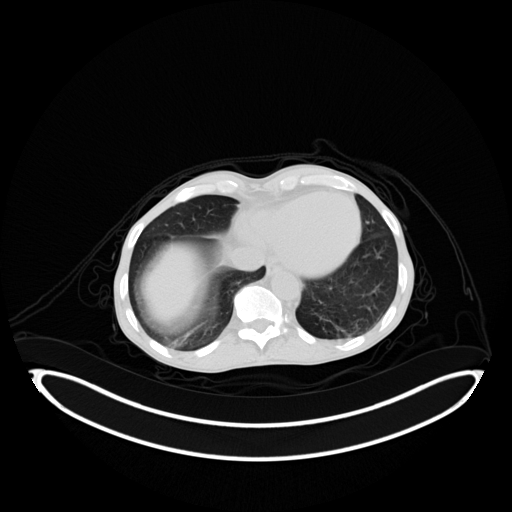
[im 203/319]
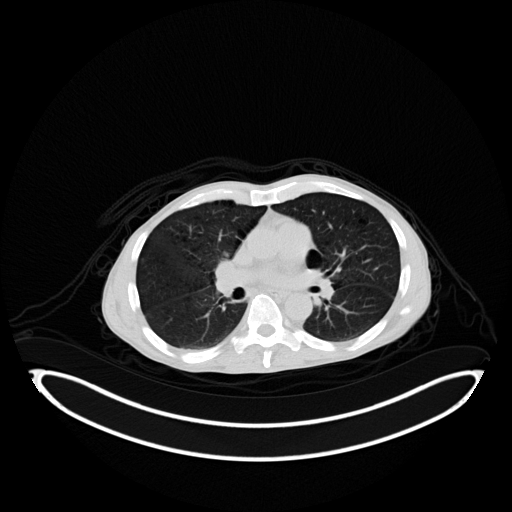
[im 232/319]
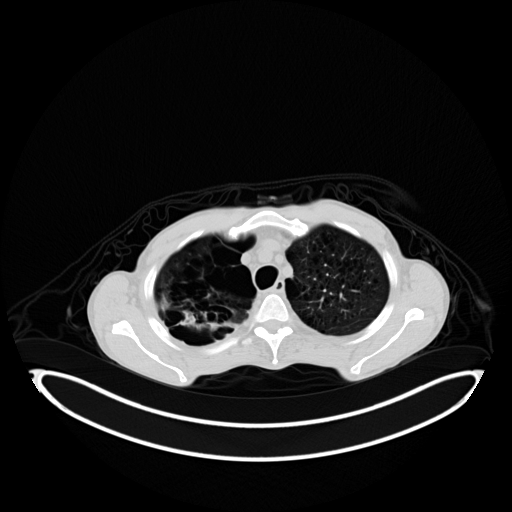
[im 261/319]
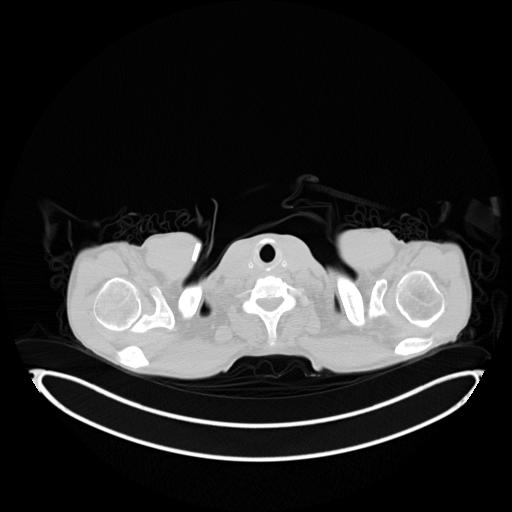
[im 290/319]
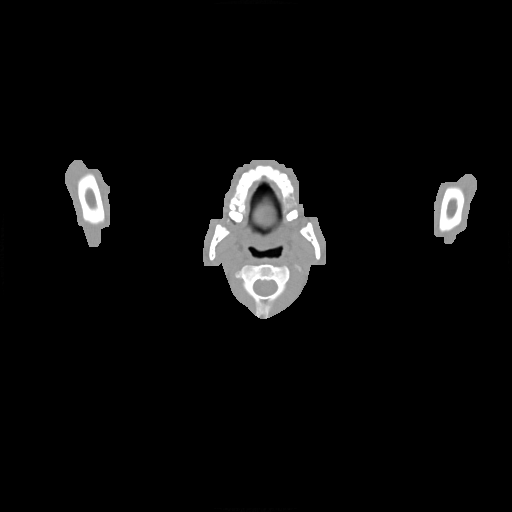
[im 319/319  brain]
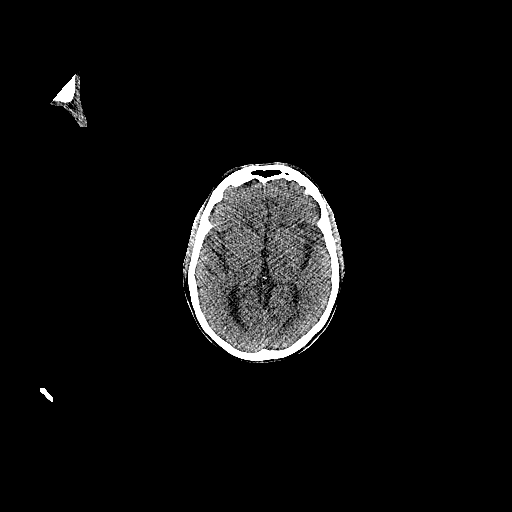

[pet/ct mip movie · axial · 1.0mm · 3.00mm/px · z∈[-24,+24]mm · 2 of 48 slices shown]
[im 1/48]
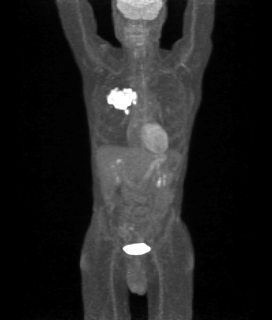
[im 48/48]
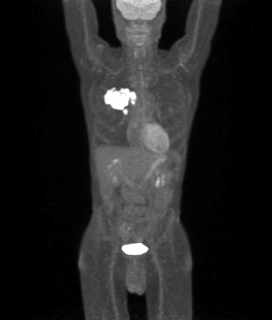

[25 of 25 positions shown; findings below may reference images not displayed]

FINDINGS: HEAD/NECK: 

PET: There is normal FDG uptake in the caudal margin of brain and in the head and neck region with no evidence of primary or metastatic malignancy.

CT:  Caudal margin of brain is normal. Orbits and paranasal sinuses are normal, except for air-fluid level in the right maxillary antrum from sinusitis. Normal soft tissue neck anatomy is present. Thoracic inlet is normal.

THORAX: 

PET: The background mediastinal maximum SUV is 1.9. There is normal FDG uptake in the chest wall, axillary regions and thoracic inlet. There is avid uptake of radiotracer in two lymph nodes located in the low right retrocaval space on image 98 with standard uptake of 18.2 with node measuring 15 x 21 mm in diameter, and a second lymph node in the right upper hilus region on image 115 with standard uptake of 18.8 with node measuring 17 x 19 mm in diameter. No other mediastinal FDG-avid nodes are seen.

Right upper lobe pulmonary mass shows avid uptake with central hypometabolism, with PERCIST activity as follows:

Baseline lesion is seen on CT images 91-114: CT diameter 8.2 x 5.8 cm, Volume 145 ml, SUVmax 30.6, SUVpeak 24.6, Glycolysis (TLG) 1421.3 SUVlbm*ml.

Fused images show a large zone of abutment with the posterior pleura with sagittal plane fused reconstructions demonstrating chest wall invasion at the posterior right fifth and sixth ribs.

On CT image 110, there is a 4 x 6 mm diameter satellite nodule in the more caudal right upper lobe showing uptake of 3.0, suggesting satellite metastasis. A second irregular 6 mm diameter pulmonary nodule is seen in the left apex showing uptake of 1.0 and will need to be followed on serial exams. No other sites of abnormal pulmonary activity are seen.

CT: The chest wall, axillary regions and thoracic inlet are normal. No additional mediastinal findings are seen on CT. Heart size is normal and no hiatal hernia is present. In addition to above pulmonary findings, centrilobular pulmonary emphysema with bulla in the right pulmonary apex and left apical pulmonary scarring are present. Minimal basal fibrosis is seen without additional pulmonary nodule.

ABDOMEN/PELVIS:

PET:  The background liver mean SUV is 2.1. PERCIST Threshold is 3.0. There is normal FDG uptake in the liver, spleen, adrenal glands and other major organs in the abdomen and pelvis with no evidence of metastatic disease or other significant findings. There is low-level activity in the stomach in a diffuse pattern most consistent with inflammatory disease, but it is important to note that the patient had a treated gastric lymphoma in 2991. Lack of focal uptake would indicate benign etiology. There is no abnormal uptake in the mid abdomen or pelvis.

CT: The liver, spleen, gallbladder and biliary system, pancreas, adrenal glands and kidneys are normal, except for a hypodense mass in the lateral aspect of the left mid kidney measuring 34 x 36 mm in diameter, which has a central density of 5 Hounsfield units, but also contains a central calcification measuring 5 mm in diameter. Corresponding PET shows no abnormal metabolic activity associated with this renal mass, which can be followed on subsequent exams.

No gastric abnormality is identified. Periaortic and paracaval spaces and pelvic floor are normal, except for scattered sigmoid diverticula. Bladder, prostate gland and inguinal regions are normal.

SKELETAL:

PET: There is normal osseous uptake of radiotracer in the skeletal system with no evidence of metastatic bone disease other than for chest wall invasion discussed above.

CT: Normal osseous anatomy is seen on CT other than for degenerative change in spine. Although the primary bronchogenic carcinoma in the right upper lobe abuts the posterior right fifth and sixth ribs, no definite rib destruction is identified on study.
IMPRESSION: 1. Today's PET/CT is compared to report of CT angiogram of chest, 05/10/2019. There is avid uptake of radiotracer corresponding to the centrally ischemic right upper lobe bronchogenic carcinoma with low-level uptake in an adjacent nodule of the right upper lobe and a small nodule in the left apex, both of which will need to be followed.

2. Regional lymphadenopathy is seen limited to the right superior hilus and retrocaval space at the level of the carina with no distant metastases noted.

TNM staging: T4 N2 M0 or stage IIIB bronchogenic carcinoma.

3. Low-level diffuse uptake in the gastric wall is seen, most likely related to gastritis, although the patient in 2991 was treated for a gastric lymphoma.

4. Left renal cyst with septal calcification, Bosniak II cyst, which will need to be followed.

5. I have requested recent CT angiogram chest studies for comparison with this exam and addended report will be issued once received.

PERCIST reporting definitions and therapy response criteria, please click link below.

[URL]

STAT Fax

## 2020-05-06 IMAGING — CT CT CHEST/ABD/PELVIS W CON
2 of 6 series · 11 of 46 positions shown, 12 images · non-contrast
Comparison: none

[Series 4: soft tissue · axial · 0.50mm/px · z∈[-1616,-1096]mm · 8 of 128 slices shown, 9 images]
[im 12/128  soft-tissue]
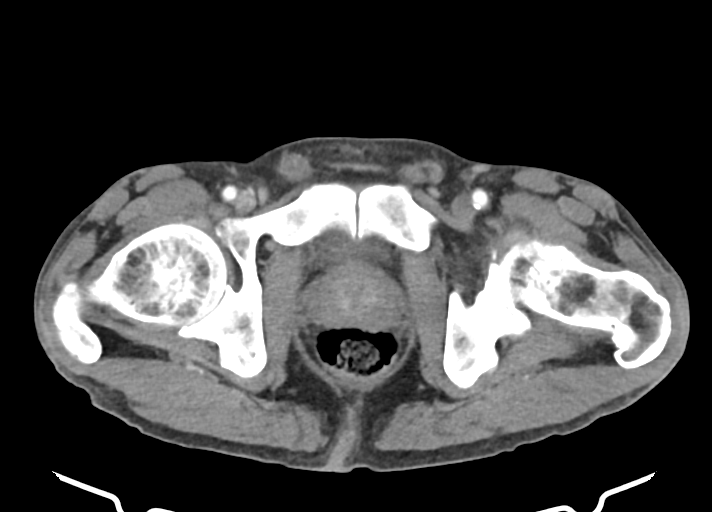
[im 12/128  bone]
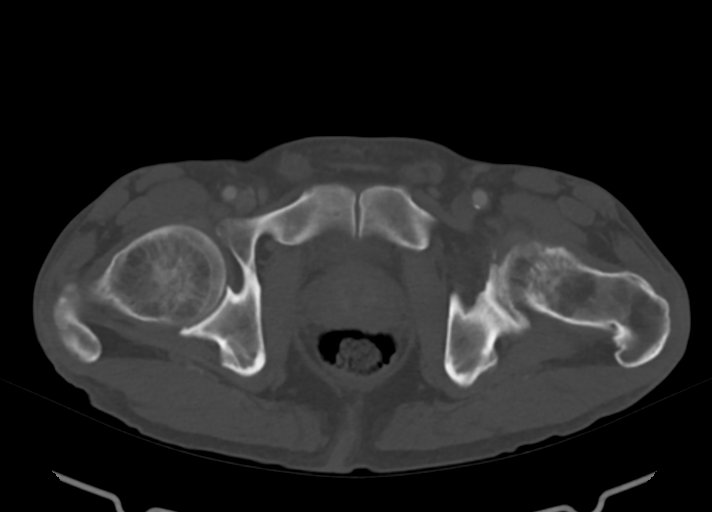
[im 24/128  soft-tissue]
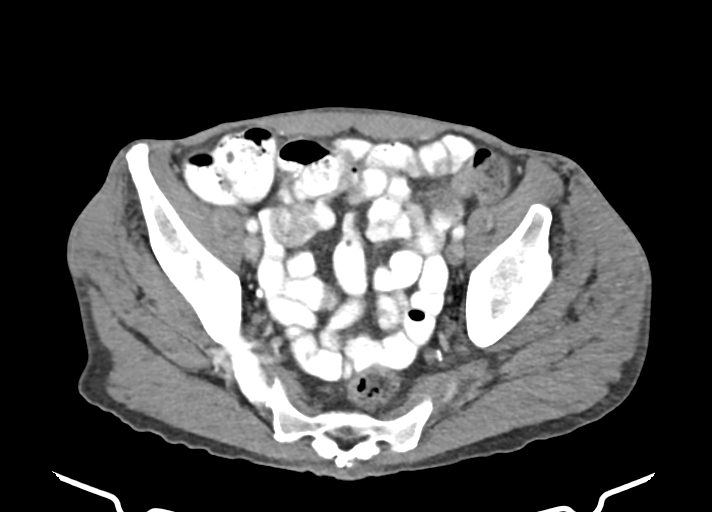
[im 47/128  soft-tissue]
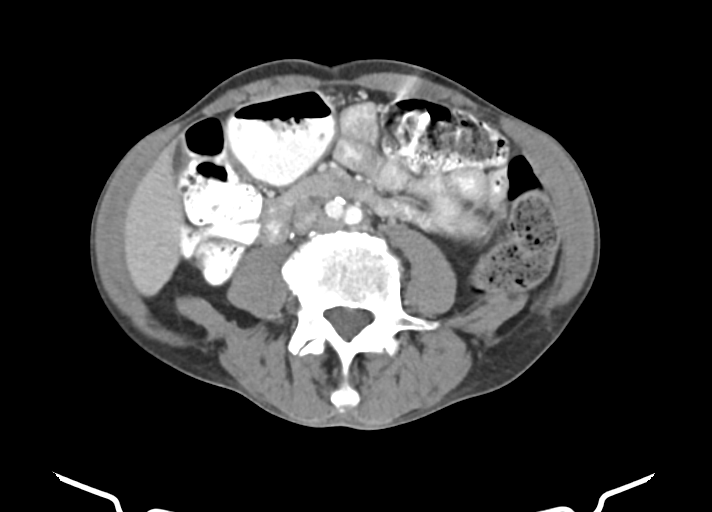
[im 58/128  soft-tissue]
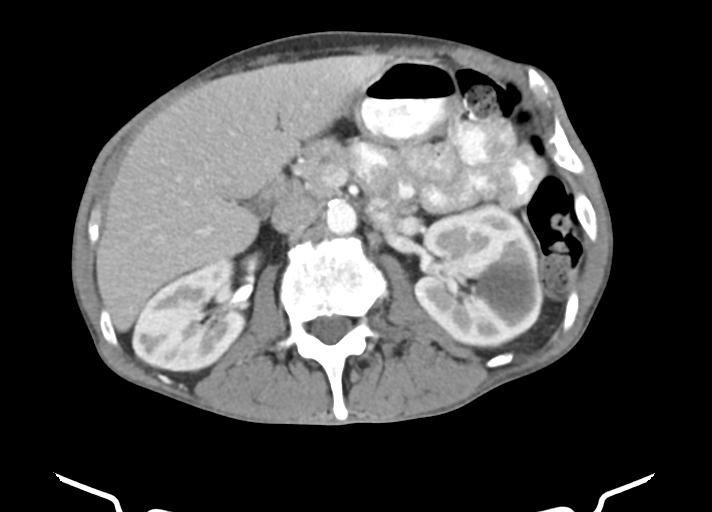
[im 70/128  soft-tissue]
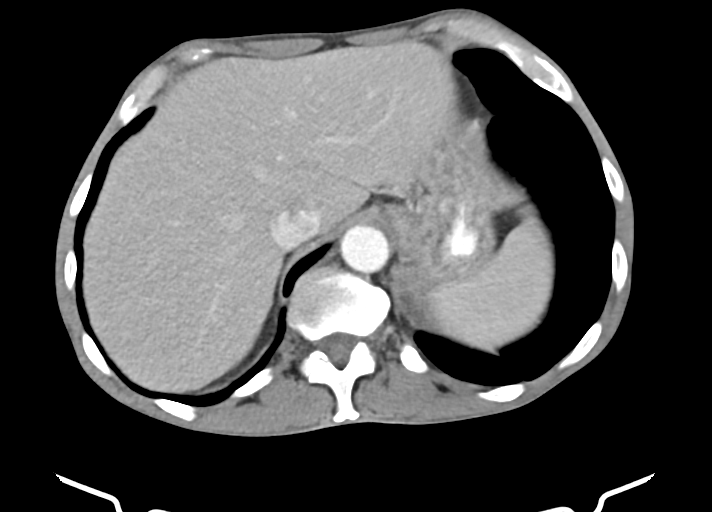
[im 81/128  soft-tissue]
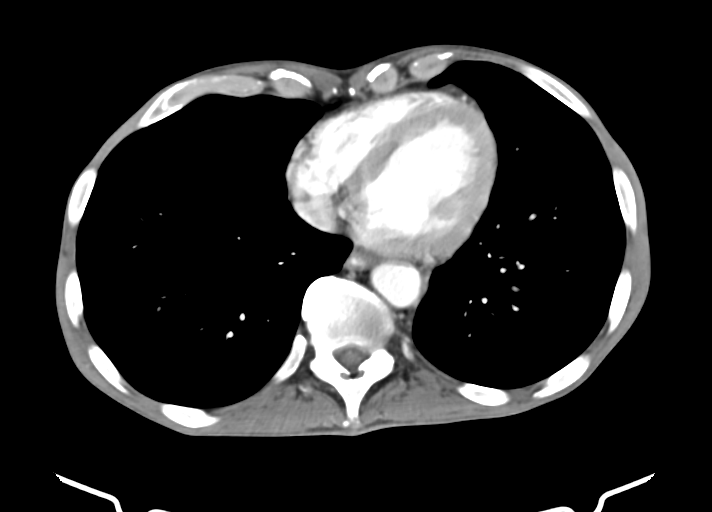
[im 104/128  soft-tissue]
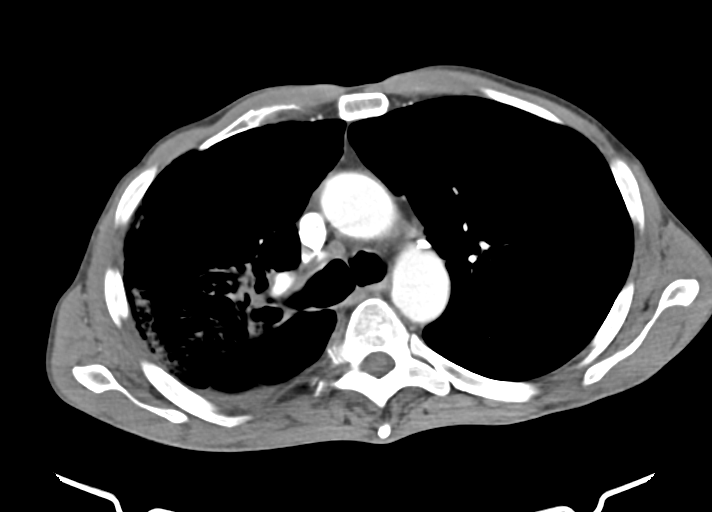
[im 116/128  soft-tissue]
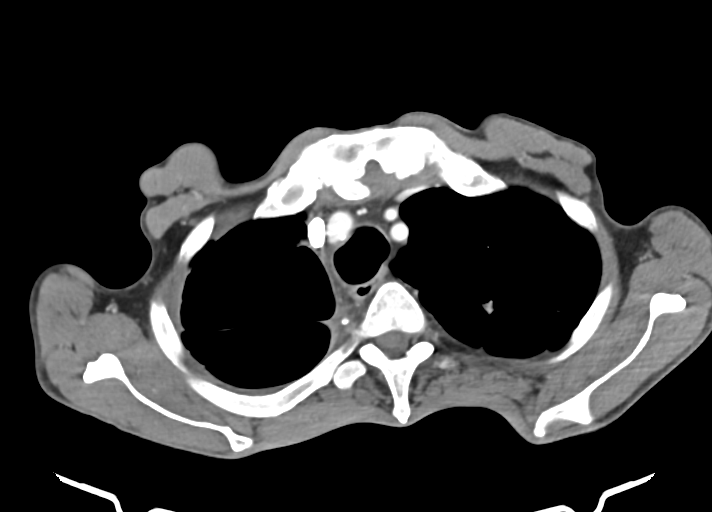

[Series 6: coronal · coronal · 0.69mm/px · 3 of 45 slices shown]
[im 15/45  soft-tissue]
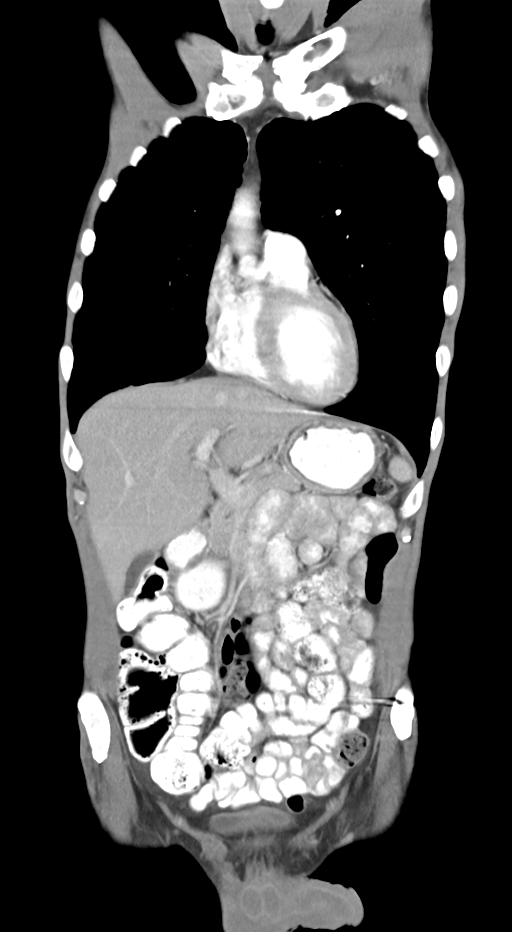
[im 20/45  soft-tissue]
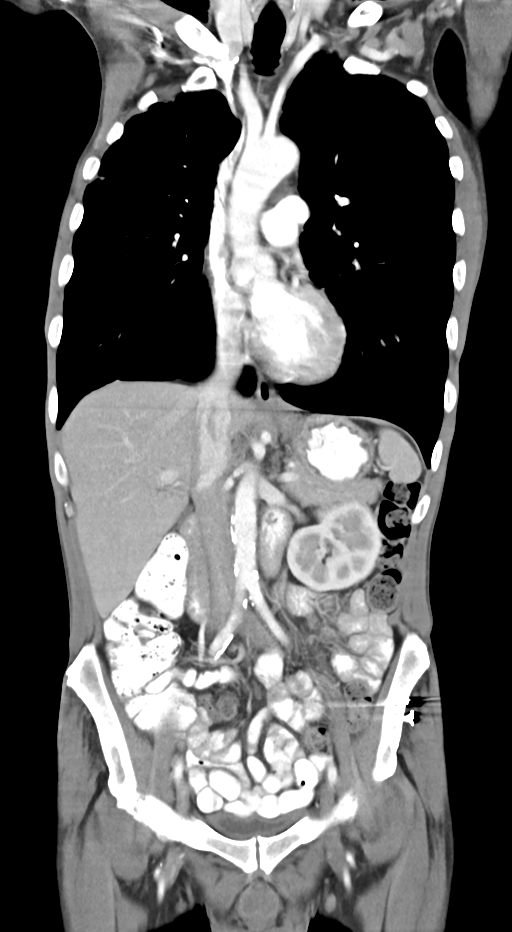
[im 25/45  soft-tissue]
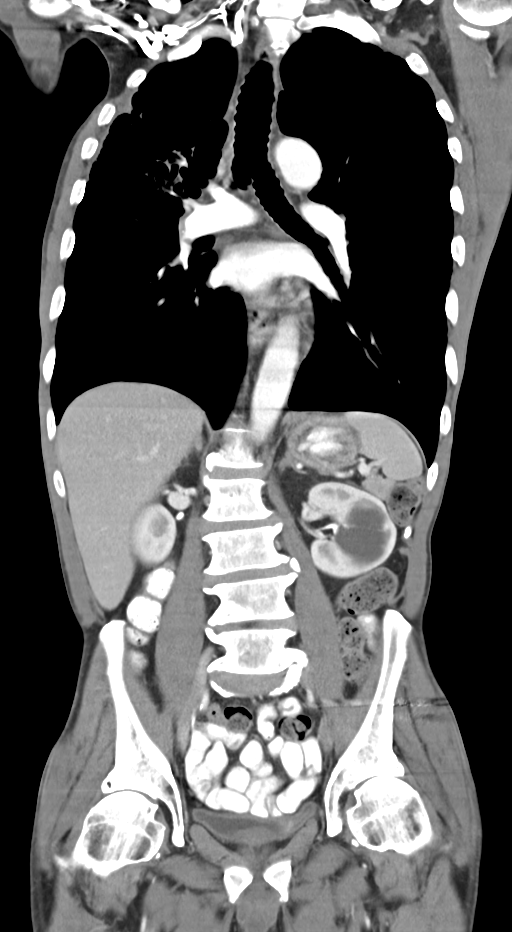

[11 of 46 positions shown; findings below may reference images not displayed]

REASON FOR EXAM

*
Lung cancer

COMPARISON

*
PET/CT 10/13/2019

TECHNIQUE

*
CT images of the chest, abdomen, and pelvis were acquired after the administration of 100 mL Csovue-USP IV contrast

*
900 mL Readi-Cat oral contrast

*
i-STAT Creatinine 1.0 mg/dL

*
Total radiation dose to patient is CTDIvol 15.54 mGy and DLP 446.29 mGy-cm.

FINDINGS

Mediastinum and Thoracic Inlet

*
Right lower paratracheal lymph node ([DATE])   1.5 cm  (prior)   --->   0.5 cm  (now)

*
Right hilar lymph node ([DATE])   0.8 cm  (prior)   --->   0.5 cm  (now)

*
No enlarging mediastinal or hilar lymph nodes

*
Physiologic amount of pericardial fluid

Dominant Right Upper Lobe Opacity

*
Difficult to differentiate tumor, postobstructive consolidation, and pneumonitis

*
Right upper lobe posterior segment mass/consolidation ([DATE])   5.6 x 7.2 cm  (prior)   --->   4.3 x 5.7 cm  (now)

Lungs and Airways

*
A previously described (10/13/2019) right upper lobe anterior segment 0.6 cm nodule is not evident on this exam

*
Left upper lobe apical posterior segment spiculated solid opacity ([DATE])   0.8 cm  (prior)   --->   0.9 cm  (now)

*
Small amount of airway secretions are best seen in the right mainstem bronchus

*
Mildly thickened central airways

*
Severe centrilobular and paraseptal emphysema

Pleura

*
Pleura remains thickened adjacent to the right upper lobe mass

*
No effusion

Chest Wall and Superficial Tissues

*
Mild sclerosis of the right posterior sixth rib without pathologic fracture

*
No additional new finding in the thoracic skeletal structures

Liver

*
No suspicious lesion

Gallbladder, Pancreas, Spleen

*
Normal

Adrenals, Kidneys, Urinary Bladder, Reproductive

*
Left renal cystic lesion with a few internal calcifications ([DATE]) 4.3 cm

*
Adenomatous thickening of the left adrenal gland

Gastrointestinal

*
No obstruction or inflammation of the bowel

Peritoneum and Retroperitoneum

*
No lymphadenopathy

*
Moderate atherosclerotic disease

*
No ascites

Bones and Superficial Tissues

*
No destructive lesion in the lumbar spine or pelvic bones

*
Metallic shrapnel in the left iliac bone

IMPRESSION

*
Right lung mass has decreased in size with therapy. Associated post therapeutic pneumonitis

*
Of the previously described (10/13/2019) 2 additional pulmonary nodules, one is no longer visible, and the other is stable

*
Mediastinal and right hilar lymph nodes have also decreased in size with therapy

*
Evolving sclerosis of the right sixth rib, also suggests response to therapy

## 2020-05-20 IMAGING — MR MRI BRAIN W/WO CONTRAST
12 series · 48 of 48 positions shown · IV contrast (prohance)
Comparison: MRI of brain 10/01/19 and PET/CT 10/13/19

INDICATION: 64 years-old Male with bronchogenic carcinoma right upper lung status post radiation therapy.
TECHNIQUE: Multiplanar, multisequence MRI of the brain was performed without and with intravenous contrast. The patient received an intravenous dose of 15 mL ProHance.

[Series 1: bSSFP · axial · 8.0mm · 1.17mm/px · 1 of 19 slices shown]
[im 1/19]
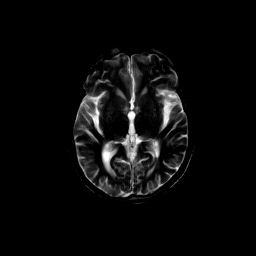

[Series 2: flair_axial_fs · axial · 5.0mm · 0.41mm/px · 1 of 28 slices shown]
[im 1/28]
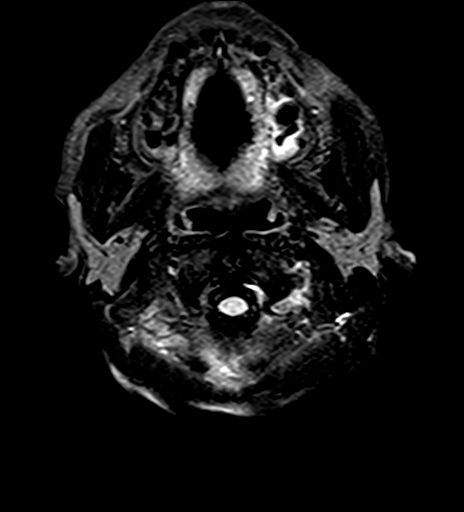

[Series 3: t2_axial · axial · 5.0mm · 0.66mm/px · 1 of 28 slices shown]
[im 1/28]
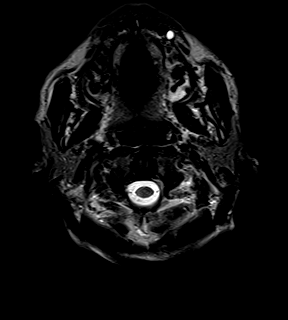

[Series 4: DWI · axial · 5.0mm · 1.23mm/px · 1 of 27 slices shown (1 of 2)]
[im 1/27]
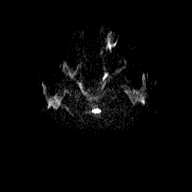

[Series 5: DWI · axial · 5.0mm · 1.23mm/px · 1 of 28 slices shown (2 of 2)]
[im 1/28]
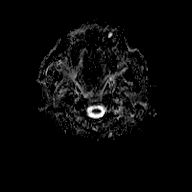

[Series 6: flash_axial · axial · 5.0mm · 0.41mm/px · 1 of 28 slices shown]
[im 1/28]
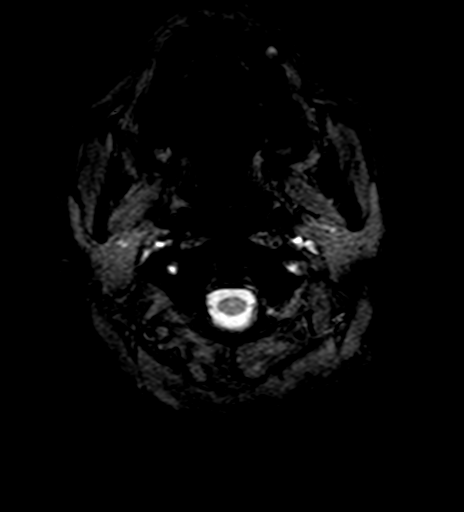

[Series 7: t1_mprage_axial_pre · axial · 1.0mm · 0.90mm/px · z∈[-124,+51]mm · 7 of 176 slices shown]
[im 1/176]
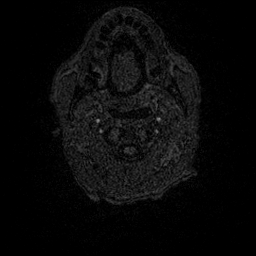
[im 30/176]
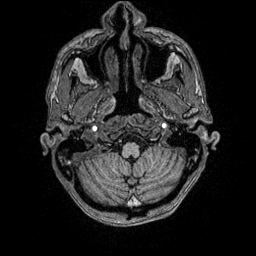
[im 59/176]
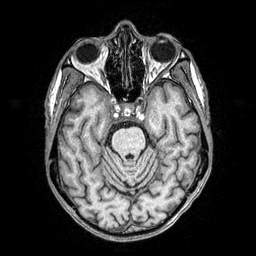
[im 88/176]
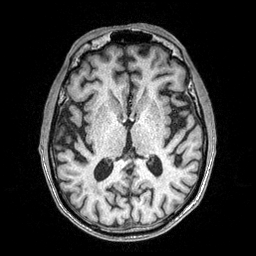
[im 117/176]
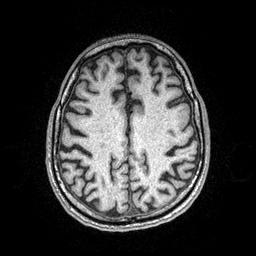
[im 146/176]
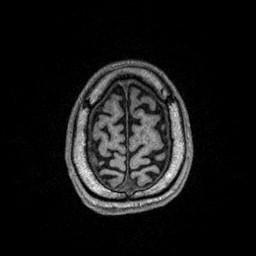
[im 176/176]
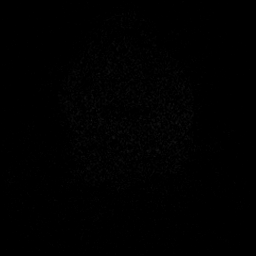

[Series 8: t1_mprage_sag_pre · sagittal · 1.0mm · 0.90mm/px · 7 of 172 slices shown]
[im 1/172]
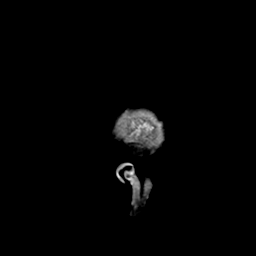
[im 29/172]
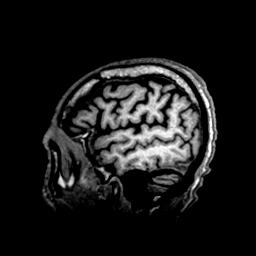
[im 58/172]
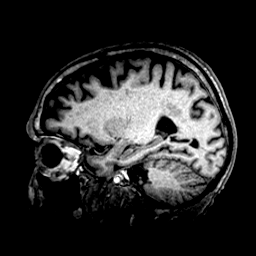
[im 86/172]
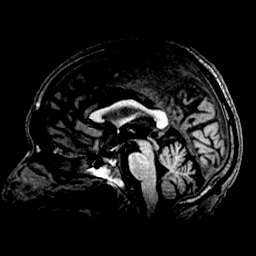
[im 115/172]
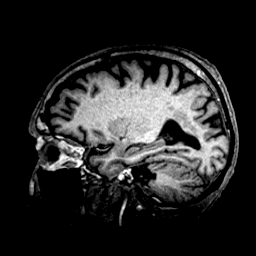
[im 143/172]
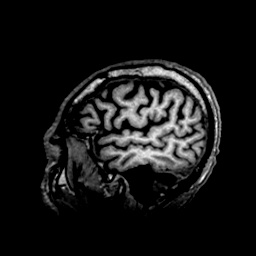
[im 172/172]
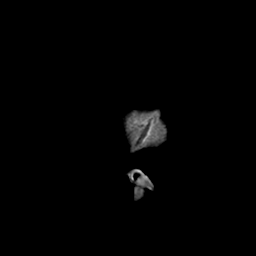

[Series 9: t1_mprage_cor_pre · coronal · 1.0mm · 0.90mm/px · 7 of 180 slices shown]
[im 1/180]
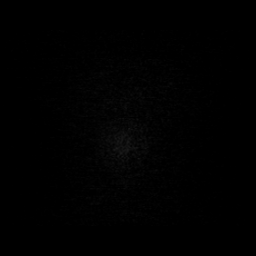
[im 30/180]
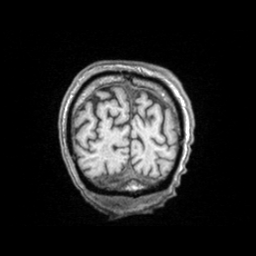
[im 60/180]
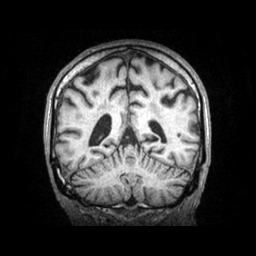
[im 90/180]
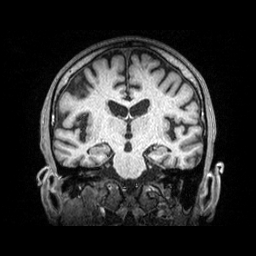
[im 120/180]
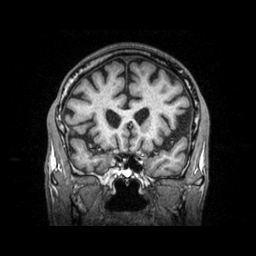
[im 150/180]
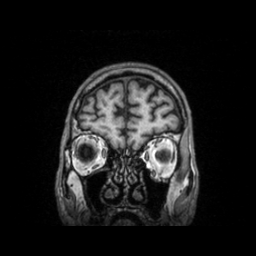
[im 180/180]
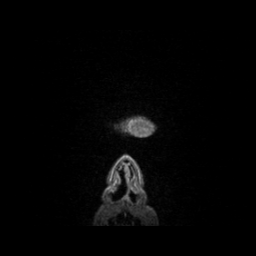

[Series 10: t1_mprage_axial_+c · axial · 1.0mm · 0.90mm/px · z∈[-124,+51]mm · 7 of 176 slices shown]
[im 1/176]
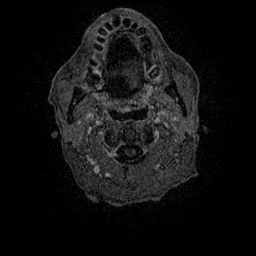
[im 30/176]
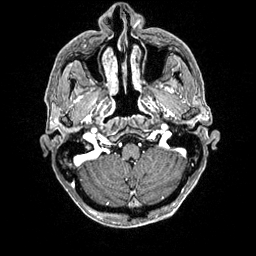
[im 59/176]
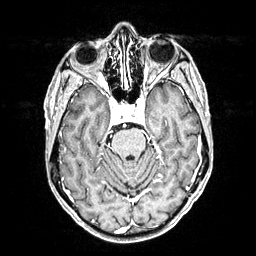
[im 88/176]
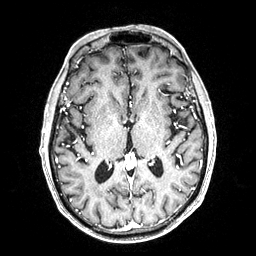
[im 117/176]
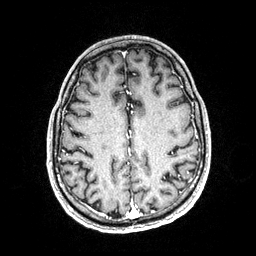
[im 146/176]
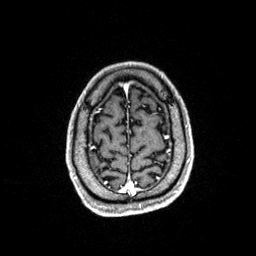
[im 176/176]
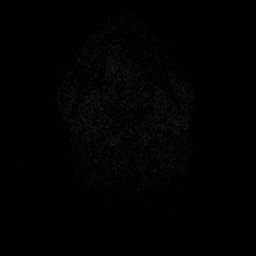

[Series 11: t1_mprage_sag_+c · sagittal · 1.0mm · 0.90mm/px · 7 of 172 slices shown]
[im 1/172]
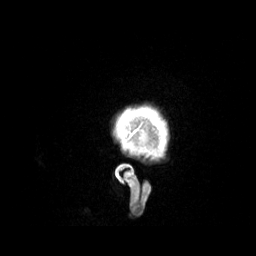
[im 29/172]
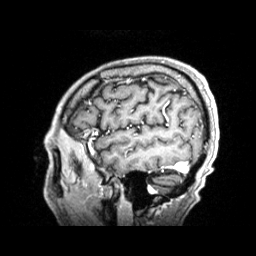
[im 58/172]
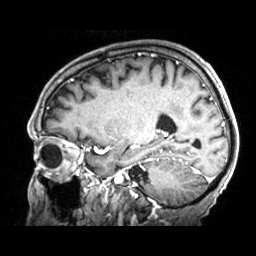
[im 86/172]
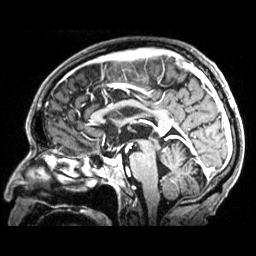
[im 115/172]
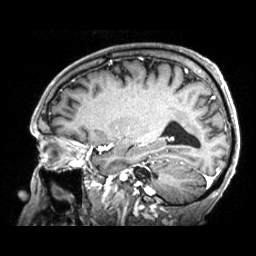
[im 143/172]
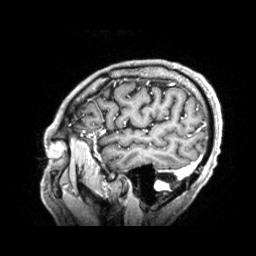
[im 172/172]
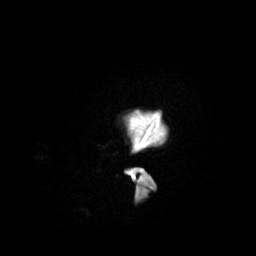

[Series 12: t1_mprage_cor_+c · coronal · 1.0mm · 0.90mm/px · 7 of 180 slices shown]
[im 1/180]
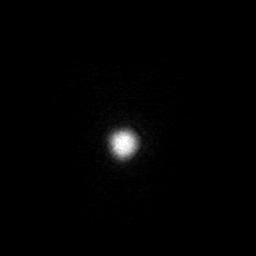
[im 30/180]
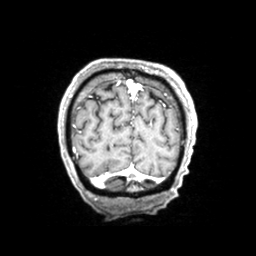
[im 60/180]
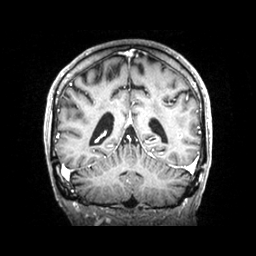
[im 90/180]
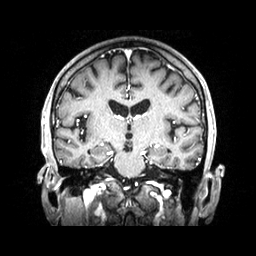
[im 120/180]
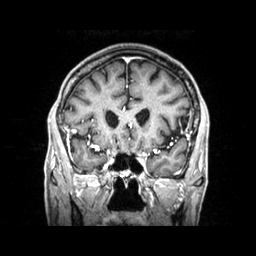
[im 150/180]
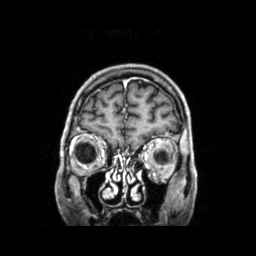
[im 180/180]
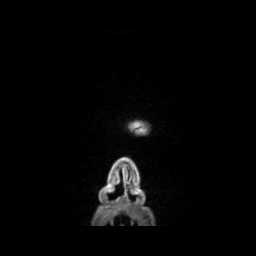

[48 of 48 positions shown; findings below may reference images not displayed]

FINDINGS: Brain parenchyma: 

- New 6 mm focus of mild restricted diffusion, T2/FLAIR hyperintense signal and peripheral contrast enhancement in the left frontal corona radiata. It measures 6 x 7 x 5 mm on contrast-enhanced images (TV X AP X CC). 

- New 4 x 3 x 4 mm enhancing lesion without restricted diffusion in the posterior left temporal lobe.

Redemonstrated chronic microhemorrhage in the left medial temporal lobe. No acute intracranial hemorrhage. Presumed white matter changes of moderate chronic small vessel ischemic disease, unchanged. Mild parenchymal atrophy.

Ventricles: No midline shift, herniation or hydrocephalus.

Extra-axial spaces: No extra-axial fluid collection.

Extracranial structures: No significant paranasal sinus mucosal disease. Small amount of fluid in the right mastoid air cells.  Orbits unremarkable. Marrow signal normal. Soft tissues normal. 

Contrast enhanced views are negative for pathologic enhancement except for the left frontal white matter lesion described above.
IMPRESSION: 4 mm metastasis in the left temporal lobe.

7 mm metastasis versus subacute lacunar infarct in the left frontal lobe. Consider follow-up MRI brain without and with intravenous contrast in 3-4 weeks. Resolution of enhancement would suggest infarct.

Moderate chronic small vessel ischemic disease. Mild parenchymal atrophy.

## 2020-07-27 IMAGING — MR MRI SRS BRAIN
8 series · 48 of 48 positions shown · IV contrast (prohance)
Comparison: MRI brain 05/20/20

INDICATION: 64-year-old male with secondary malignant neoplasm of brain.
TECHNIQUE: Multiplanar, multisequence MRI of the brain was performed without and with intravenous contrast. The patient received an intravenous dose of 15 mL ProHance. SRS protocol was used.

[Series 5: loc · sagittal · 4.0mm · 0.69mm/px · 2 of 25 slices shown]
[im 1/25]
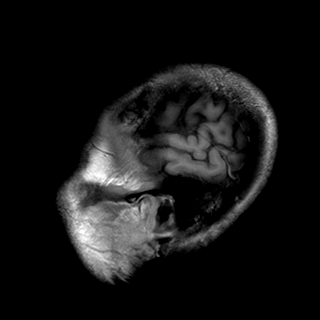
[im 25/25]
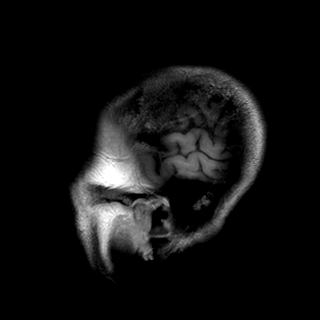

[Series 7: t1_mpr_axial_pre · axial · 1.0mm · 1.07mm/px · z∈[-53,+122]mm · 7 of 176 slices shown]
[im 1/176]
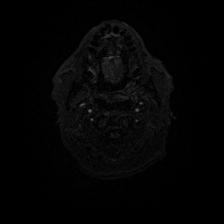
[im 30/176]
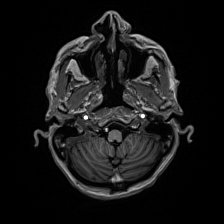
[im 59/176]
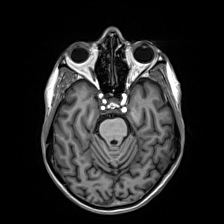
[im 88/176]
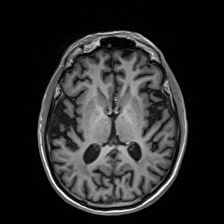
[im 117/176]
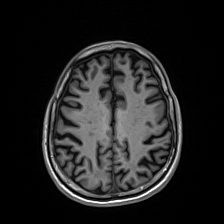
[im 146/176]
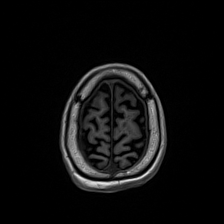
[im 176/176]
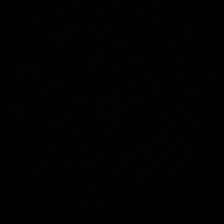

[Series 8: t1_mpr_axial_pre_mpr_sag · sagittal · 1.0mm · 1.00mm/px · 7 of 170 slices shown]
[im 1/170]
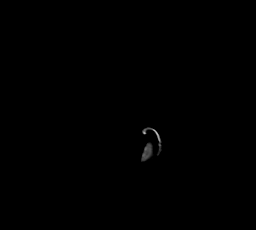
[im 29/170]
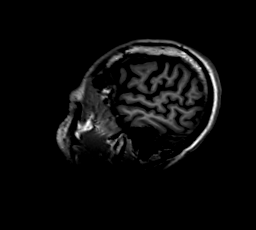
[im 57/170]
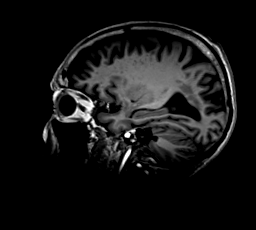
[im 85/170]
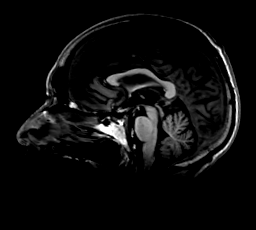
[im 113/170]
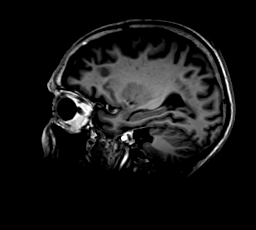
[im 141/170]
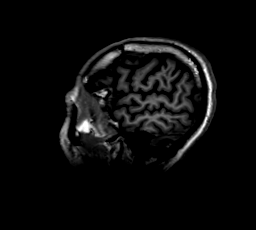
[im 170/170]
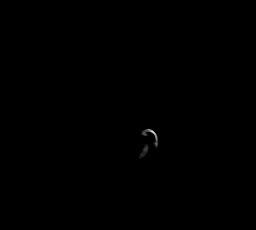

[Series 9: t1_mpr_axial_pre_mpr_cor · coronal · 1.0mm · 1.00mm/px · 8 of 195 slices shown]
[im 1/195]
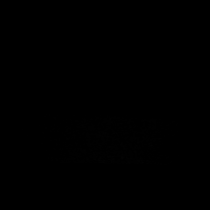
[im 28/195]
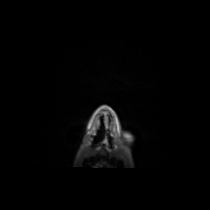
[im 56/195]
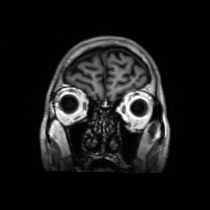
[im 84/195]
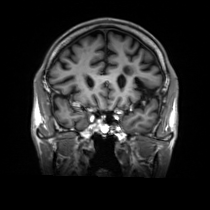
[im 111/195]
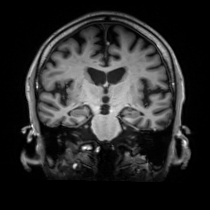
[im 139/195]
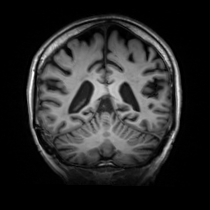
[im 167/195]
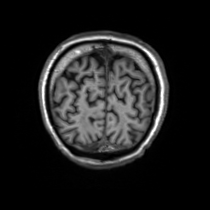
[im 195/195]
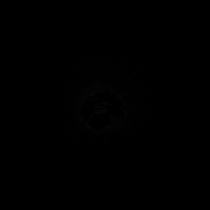

[Series 10: t2_axial · axial · 3.0mm · 0.36mm/px · z∈[-50,+111]mm · 2 of 46 slices shown]
[im 1/46]
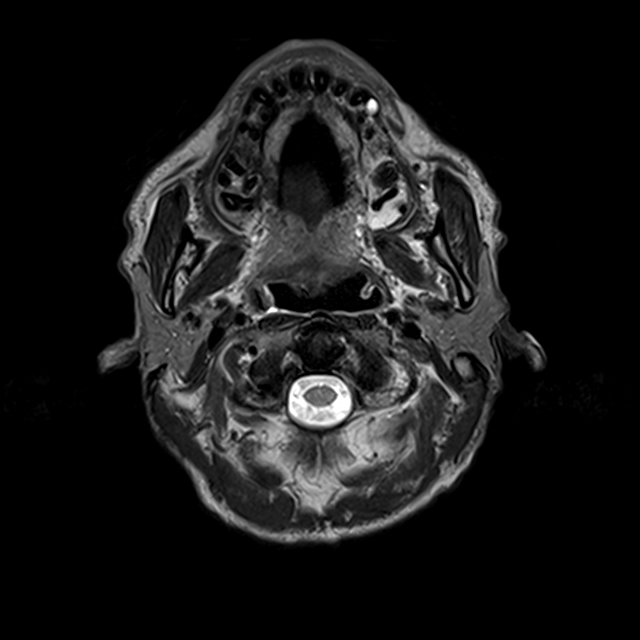
[im 46/46]
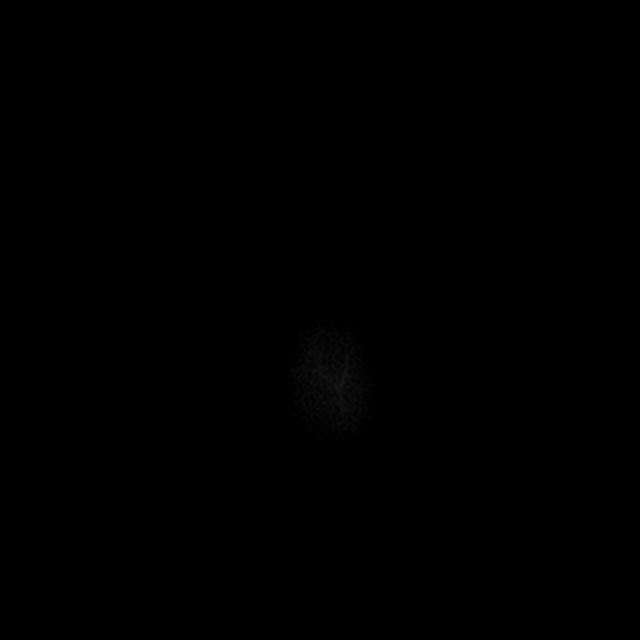

[Series 11: t1_mpr_axial_+c · axial · 1.0mm · 1.07mm/px · z∈[-53,+122]mm · 7 of 176 slices shown]
[im 1/176]
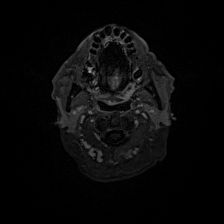
[im 30/176]
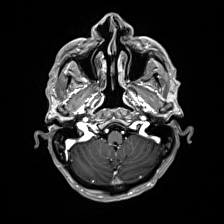
[im 59/176]
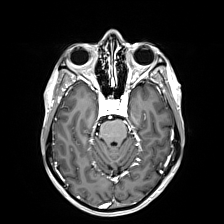
[im 88/176]
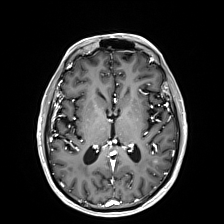
[im 117/176]
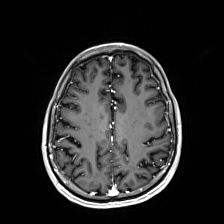
[im 146/176]
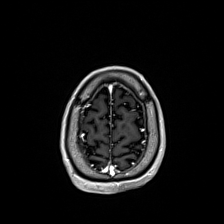
[im 176/176]
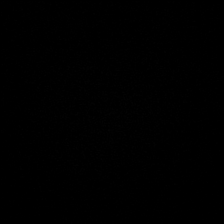

[Series 12: t1_mpr_axial_+c_mpr_sag · sagittal · 1.0mm · 1.00mm/px · 7 of 170 slices shown]
[im 1/170]
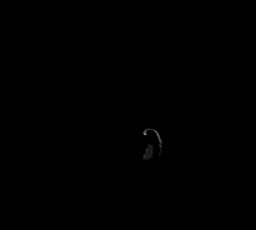
[im 29/170]
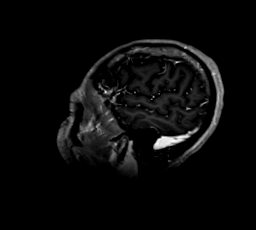
[im 57/170]
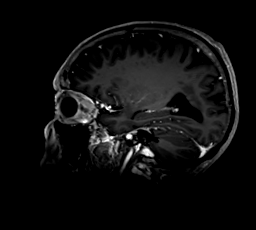
[im 85/170]
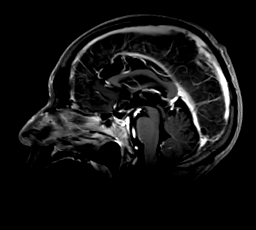
[im 113/170]
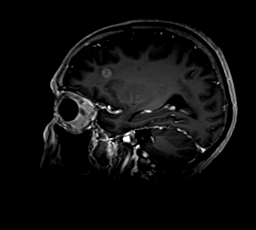
[im 141/170]
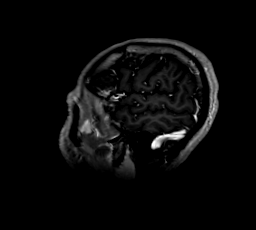
[im 170/170]
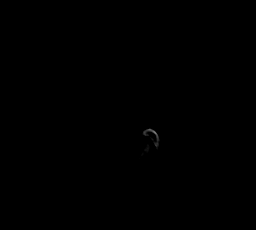

[Series 13: t1_mpr_axial_+c_mpr_cor · coronal · 1.0mm · 1.00mm/px · 8 of 196 slices shown]
[im 1/196]
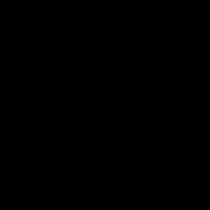
[im 28/196]
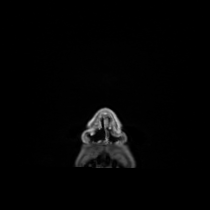
[im 56/196]
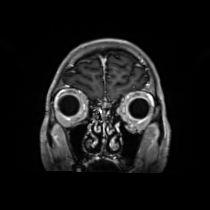
[im 84/196]
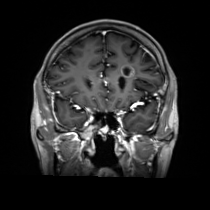
[im 112/196]
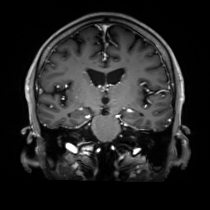
[im 140/196]
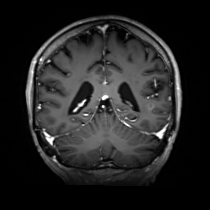
[im 168/196]
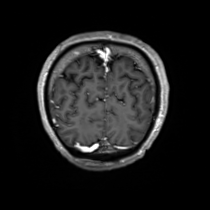
[im 196/196]
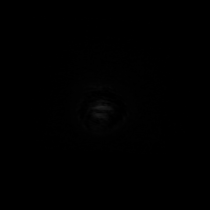

[48 of 48 positions shown; findings below may reference images not displayed]

FINDINGS: Enlarging, peripherally enhancing lesion in the left frontal corona radiata, now measuring 1.5 x 1.4 x 1.1 cm (TV X AP X CC), previously 0.6 x 0.7 x 0.5 cm. Mild surrounding vasogenic edema.

Enlarging, peripherally enhancing lesion in the posterior left temporal lobe, now measuring 1.0 x 1.1 x 1.1 cm, previously 0.4 x 0.3 x 0.4 cm. Mild surrounding vasogenic edema.

No new abnormal contrast enhancement. Patchy T2 hyperintense white matter signal changes, likely chronic small vessel ischemic disease. Mild parenchymal atrophy. Preserved vascular flow voids. Small bilateral mastoid effusions, larger on the right.
IMPRESSION: Enlarging left frontal metastasis, now measuring up to 1.5 cm, and left temporal metastasis, now measuring up to 1.1 cm.

No new lesion.

## 2020-08-25 IMAGING — PT PET CT SKULL BASE TO THIGH_RESTAGING
1 of 5 series · 1 of 25 positions shown · non-contrast
Comparison: none

[Series 3: ct pet 4.0 br38 · axial · 4.0mm · 0.98mm/px · 1 of 362 slices shown]
[im 321/362  brain]
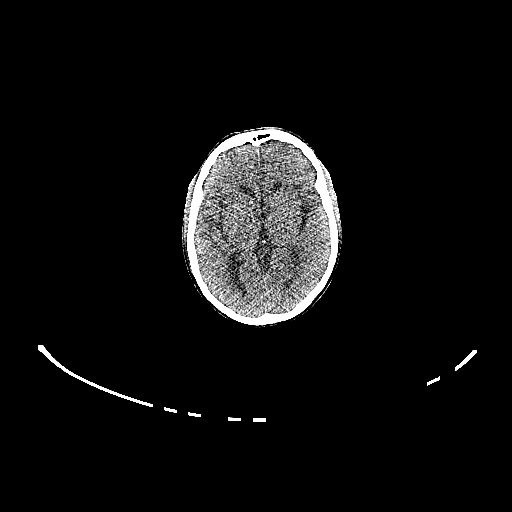

[1 of 25 positions shown; findings below may reference images not displayed]

REASON FOR EXAM

*
Malignant neoplasm of unspecified part of unspecified bronchus or lung

*
Last chemotherapy [DATE]

*
Last radiation therapy [DATE]

COMPARISON

*
CT chest, abdomen, pelvis 05/06/2020

*
PET/CT 10/13/2019

TECHNIQUE

*
Height 72 inches

*
Weight 129.0 pounds

*
Serum glucose 43 mg/dL

*
12.63 mCi F-18 Fluorodeoxyglucose was administered intravenously in a left antecubital vein

*
66 minutes rest period after injection

*
Imaging field of view was skull base to mid-thigh

*
PET images were acquired and corrected for attenuation and time of flight

*
CT images were acquired without IV or oral contrast

*
PET, CT, and fused PET/CT images were reviewed at an independent reading station

*
Unless otherwise indicated, the standardized uptake value (SUV) is the maximum single pixel intensity within a region of interest, corrected for lean body mass

FINDINGS

BACKGROUND

*
Previous Liver 2.1 SUVmean

*
Current Liver 1.7 SUVmean

*
PERCIST Threshold 2.9 SUVpeak

METABOLIC FINDINGS

Primary Tumor

*
Right lung mass (upper lobe) has essentially disappeared on PET. All that remains is a background of patchy pneumonitis

*
On the 10/13/2019 staging exam, the mass displayed 30.6 SUV

*
On today's exam, residual pneumonitis throughout the right upper lobe (non-focal) reveals 4.1 SUV

*
Similarly on CT, the right upper lobe displays irregular opacities (many linear) with volume loss. The disease no longer has a masslike appearance on CT

*
Previously described (10/13/2019) right upper lobe satellite nodule also no longer evident

Lymph Nodes

*
Mediastinal and right hilar lymph nodes have completely responded to therapy (no hypermetabolic lymph nodes in the thorax on today's exam)

*
Some comparisons to the initial staging 10/13/2019 PET/CT provided below

*
4R (139)   18.2 SUV   --->   1.0 SUV

*
11R (154)   18.8 SUV   --->   1.7 SUV 

Brain

*
Known metastases are poorly visualized on PET

Metastases

*
No extracranial FDG-avid distant metastases

Additional Activity

*
Stable left lung apical nodule remains non-FDG-avid--most likely scar (125)   0.8 SUV   0.8 cm

*
Minimal reactive hepatitis in the subcapsular liver near the diaphragm (187)   2.5 SUV

*
Low-level post-therapeutic inflammation in the right chest wall (130)   2.5 SUV

*
Geographic region of decreased thoracic marrow activity, compatible with radiation therapy

*
Sclerosis in the right posterior sixth rib is not metabolically active (143)   1.9 SUV

ANATOMIC FINDINGS

*
Multiple absent teeth

*
Atherosclerotic disease

*
No pleural effusion

*
Severe emphysema

*
Stable complex left renal cyst

*
Metallic shrapnel in the left iliac bone

*
Degenerative joint disease

IMPRESSION

*
Today's PET/CT is compared to PET/CT 10/13/2019 and CT chest, abdomen, pelvis 05/06/2020

*
Significant response to therapy

*
Right lung mass (and satellite nodule) are no longer visible on PET or CT. Dominant finding in the treatment zone is now a mild/moderate pneumonitis

*
Mediastinal and right hilar lymphadenopathy has also completely resolved

*
Known intracranial metastases are not visible on PET. No FDG-avid metastases on today's exam

*
Stable left lung apical nodule remains non-FDG-avid, most likely scar

## 2020-12-17 DEATH — deceased
